# Patient Record
Sex: Male | Born: 1955 | Race: Black or African American | Hispanic: No | Marital: Married | Smoking: Never smoker
Health system: Southern US, Community
[De-identification: ages and names within clinical notes are randomized; demographics above are authoritative.]

## PROBLEM LIST (undated history)

## (undated) DIAGNOSIS — N201 Calculus of ureter: Secondary | ICD-10-CM

## (undated) DIAGNOSIS — E119 Type 2 diabetes mellitus without complications: Secondary | ICD-10-CM

## (undated) DIAGNOSIS — N289 Disorder of kidney and ureter, unspecified: Secondary | ICD-10-CM

## (undated) HISTORY — PX: ABDOMINAL SURGERY: SHX537

---

## 2005-11-01 ENCOUNTER — Encounter (INDEPENDENT_AMBULATORY_CARE_PROVIDER_SITE_OTHER): Payer: Self-pay | Admitting: Specialist

## 2005-11-02 ENCOUNTER — Inpatient Hospital Stay (HOSPITAL_COMMUNITY): Admission: EM | Admit: 2005-11-02 | Discharge: 2005-11-03 | Payer: Self-pay | Admitting: Emergency Medicine

## 2008-12-13 ENCOUNTER — Emergency Department (HOSPITAL_COMMUNITY): Admission: EM | Admit: 2008-12-13 | Discharge: 2008-12-14 | Payer: Self-pay | Admitting: Emergency Medicine

## 2010-07-15 LAB — URINE MICROSCOPIC-ADD ON

## 2010-07-15 LAB — URINALYSIS, ROUTINE W REFLEX MICROSCOPIC
Leukocytes, UA: NEGATIVE
Protein, ur: 100 mg/dL — AB
Urobilinogen, UA: 0.2 mg/dL (ref 0.0–1.0)
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
## 2012-04-26 ENCOUNTER — Emergency Department
Admission: EM | Admit: 2012-04-26 | Discharge: 2012-04-26 | Disposition: A | Discharge status: Home / Self Care | Payer: Self-pay | Attending: Emergency Medicine | Admitting: Emergency Medicine

## 2012-04-26 ENCOUNTER — Emergency Department: Payer: Self-pay

## 2012-04-26 DIAGNOSIS — M542 Cervicalgia: Secondary | ICD-10-CM | POA: Insufficient documentation

## 2012-04-26 DIAGNOSIS — R42 Dizziness and giddiness: Secondary | ICD-10-CM | POA: Insufficient documentation

## 2012-04-26 LAB — ECG 12-LEAD
Atrial Rate: 61 {beats}/min
P Axis: 77 degrees
P-R Interval: 168 ms
Q-T Interval: 394 ms
QRS Duration: 72 ms
QTC Calculation (Bezet): 396 ms
R Axis: 54 degrees
T Axis: 67 degrees
Ventricular Rate: 61 {beats}/min

## 2012-04-26 LAB — CBC AND DIFFERENTIAL
Basophils Absolute Automated: 0.04 10*3/uL (ref 0.00–0.20)
Basophils Automated: 1 %
Eosinophils Absolute Automated: 0.23 10*3/uL (ref 0.00–0.70)
Eosinophils Automated: 4 %
Hematocrit: 46.9 % (ref 42.0–52.0)
Hgb: 16.4 g/dL (ref 13.0–17.0)
Immature Granulocytes Absolute: 0.02 10*3/uL
Immature Granulocytes: 0 %
Lymphocytes Absolute Automated: 2.23 10*3/uL (ref 0.50–4.40)
Lymphocytes Automated: 41 %
MCH: 29.5 pg (ref 28.0–32.0)
MCHC: 35 g/dL (ref 32.0–36.0)
MCV: 84.4 fL (ref 80.0–100.0)
MPV: 10.6 fL (ref 9.4–12.3)
Monocytes Absolute Automated: 0.51 10*3/uL (ref 0.00–1.20)
Monocytes: 9 %
Neutrophils Absolute: 2.37 10*3/uL (ref 1.80–8.10)
Neutrophils: 44 %
Nucleated RBC: 0 /100 WBC (ref 0–1)
Platelets: 301 10*3/uL (ref 140–400)
RBC: 5.56 10*6/uL (ref 4.70–6.00)
RDW: 13 % (ref 12–15)
WBC: 5.4 10*3/uL (ref 3.50–10.80)

## 2012-04-26 LAB — BASIC METABOLIC PANEL
BUN: 9 mg/dL (ref 9.0–21.0)
CO2: 27 mEq/L (ref 22–29)
Calcium: 9.3 mg/dL (ref 8.5–10.5)
Chloride: 104 mEq/L (ref 98–107)
Creatinine: 1 mg/dL (ref 0.7–1.3)
Glucose: 103 mg/dL — ABNORMAL HIGH (ref 70–100)
Potassium: 4 mEq/L (ref 3.5–5.1)
Sodium: 141 mEq/L (ref 136–145)

## 2012-04-26 LAB — GFR: EGFR: >60

## 2012-04-26 LAB — I-STAT TROPONIN: i-STAT Troponin: 0 ng/mL (ref 0.00–0.09)

## 2012-04-26 MED ORDER — ONDANSETRON HCL 4 MG/2ML IJ SOLN
4.0000 mg | Freq: Once | INTRAMUSCULAR | Status: AC
Start: 2012-04-26 — End: 2012-04-26
  Administered 2012-04-26: 4 mg via INTRAVENOUS
  Filled 2012-04-26: qty 2

## 2012-04-26 MED ORDER — MECLIZINE HCL 25 MG PO TABS
25.0000 mg | ORAL_TABLET | Freq: Three times a day (TID) | ORAL | Status: AC | PRN
Start: 2012-04-26 — End: 2012-05-06

## 2012-04-26 MED ORDER — MECLIZINE HCL 12.5 MG PO TABS
25.0000 mg | ORAL_TABLET | Freq: Once | ORAL | Status: AC
Start: 2012-04-26 — End: 2012-04-26
  Administered 2012-04-26: 25 mg via ORAL
  Filled 2012-04-26: qty 2

## 2012-04-26 MED ORDER — SODIUM CHLORIDE 0.9 % IV BOLUS
1000.0000 mL | Freq: Once | INTRAVENOUS | Status: AC
Start: 2012-04-26 — End: 2012-04-26
  Administered 2012-04-26: 1000 mL via INTRAVENOUS

## 2012-04-26 NOTE — ED Provider Notes (Signed)
Physician/Midlevel provider first contact with patient: 04/26/12 1430             EMERGENCY DEPARTMENT HISTORY AND PHYSICAL EXAM    Date: 04/26/2012  Patient Name: Tyler West,Tyler West      Disposition and Treatment Plan    Clinical Impression:   1. Vertigo      ED Disposition     Discharge Barry Edy discharge to home/self care.    Condition at discharge: Good               History of Presenting Illness     Chief Complaint   Patient presents with   . Dizziness     Context: at rest  Location: n/a  Duration: onset last night, worse at 0400 when he woke up  Quality: dizziness  Timing: intermittent  Maximum Severity: moderate  Modifying Factors: worse when getting up  History Obtained From: patient and family member  Additional History: Tyler West is a 57 y.o. male o/w healthy c/o dizziness last night a/w visual deficits in both eyes, HA, and palpitations. Pt reports he had mild dizziness when he went to bed last night. Pt reports dizziness got much worse when he got out of bed this am ~0400 and he fell to the floor. Pt then got diaphoresis, nauseous and emesis x1. Pt sat down in a chair for ~30 minutes, pt reports dizziness got slightly better, and less dizziness after urination. Pt reports no dizziness in ED. Pt reports he has intermittent episodes of dizziness at work when going up and down stairs. Pt also c/o intermittent c spine pain that has gotten more frequent in the past week. Pt had cold sxs earlier this month, and has been taking left over amoxicillin. Pt denies CP, SOB, and any other symptoms or concerns.     PCP: Pcp, Noneorunknown, MD    Current facility-administered medications:[COMPLETED] meclizine (ANTIVERT) tablet 25 mg, 25 mg, Oral, Once, Mechanick, Bosie Clos, MD, 25 mg at 04/26/12 1558;  [COMPLETED] ondansetron (ZOFRAN) injection 4 mg, 4 mg, Intravenous, Once, Mechanick, Bosie Clos, MD, 4 mg at 04/26/12 1558;  [COMPLETED] sodium chloride 0.9 % bolus 1,000 mL, 1,000 mL, Intravenous, Once, Maurine Minister, MD,  1,000 mL at 04/26/12 1558  No current outpatient prescriptions on file.    Past Medical History     History reviewed. No pertinent past medical history.  Past Surgical History   Procedure Date   . Abdominal surgery        Family History     History reviewed. No pertinent family history.    Social History     History     Social History   . Marital Status: Divorced     Spouse Name: N/A     Number of Children: N/A   . Years of Education: N/A     Social History Main Topics   . Smoking status: Never Smoker    . Smokeless tobacco: Not on file   . Alcohol Use: No   . Drug Use: No   . Sexually Active: Not on file     Other Topics Concern   . Not on file     Social History Narrative   . No narrative on file       Allergies     No Known Allergies    Review of Systems     Positive and negative ROS elements as per HPI.  All Other Systems Reviewed and Negative: Yes    Physical Exam   BP 168/98  Pulse 54  Temp 95.5 F (35.3 C) (Oral)  Resp 18  Ht 1.651 m  Wt 58.514 kg  BMI 21.47 kg/m2  SpO2 100%  Physical Exam   Nursing note and vitals reviewed.  Constitutional: He is oriented to person, place, and time and well-developed, well-nourished, and in no distress. No distress.   HENT:   Head: Normocephalic and atraumatic.   Right Ear: External ear normal.   Left Ear: External ear normal.   Mouth/Throat: No oropharyngeal exudate.   Eyes: Conjunctivae normal and EOM are normal. Pupils are equal, round, and reactive to light.   Neck: Normal range of motion. No JVD present. No tracheal deviation present.   Cardiovascular: Normal rate, regular rhythm and normal heart sounds.  Exam reveals no gallop and no friction rub.    No murmur heard.  Pulmonary/Chest: Effort normal and breath sounds normal. No respiratory distress. He has no wheezes. He has no rales.   Abdominal: Soft. He exhibits no distension. There is no tenderness. There is no rebound.   Neurological: He is alert and oriented to person, place, and time. No cranial nerve  deficit. Coordination normal. GCS score is 15.   Skin: Skin is warm. No rash noted. He is not diaphoretic. No erythema. No pallor.   Psychiatric: Mood, memory, affect and judgment normal.         Diagnostic Study Results   Labs -     Results     Procedure Component Value Units Date/Time    Basic Metabolic Panel (BMP) [[id number]]  (Abnormal) Collected:04/26/12 1556    Specimen Information:Blood Updated:04/26/12 1629     Glucose 103 (H) mg/dL      BUN 9.0 mg/dL      Creatinine 1.0 mg/dL      Calcium 9.3 mg/dL      Sodium 027 mEq/L      Potassium 4.0 mEq/L      Chloride 104 mEq/L      CO2 27 mEq/L     GFR [[id number]] Collected:04/26/12 1556     EGFR >60.0   Updated:04/26/12 1629    CBC and Differential [[id number]] Collected:04/26/12 1556    Specimen Information:Blood / Blood Updated:04/26/12 1613     WBC 5.40 x10 3/uL      RBC 5.56 x10 6/uL      Hgb 16.4 g/dL      Hematocrit 87.5 %      MCV 84.4 fL      MCH 29.5 pg      MCHC 35.0 g/dL      RDW 13 %      Platelets 301 x10 3/uL      MPV 10.6 fL      Neutrophils 44 %      Lymphocytes Automated 41 %      Monocytes 9 %      Eosinophils Automated 4 %      Basophils Automated 1 %      Immature Granulocyte 0 %      Nucleated RBC 0 /100 WBC      Neutrophils Absolute 2.37 x10 3/uL      Abs Lymph Automated 2.23 x10 3/uL      Abs Mono Automated 0.51 x10 3/uL      Abs Eos Automated 0.23 x10 3/uL      Absolute Baso Automated 0.04 x10 3/uL      Absolute Immature Granulocyte 0.02 x10 3/uL     i-Stat Troponin [[id number]] Collected:04/26/12 1556     i-STAT Troponin 0.00  ng/mL Updated:04/26/12 1609        Radiologic Studies -   Radiology Results (24 Hour)     Procedure Component Value Units Date/Time    CT Head without Contrast [[id number]] Collected:04/26/12 1730    Order Status:Completed  Updated:04/26/12 1736    Narrative:    HISTORY: Dizziness     TECHNIQUE: Routine  CT head without  contrast.  COMPARISON: None.  FINDINGS:      Images are limited due to patient motion.   Unremarkable  brain for age.   No evidence of acute ischemia.  No acute hemorrhage, nor extra-axial fluid collection.  Ventricles and basal cistern are normal without mass effect.           Impression:     Unremarkable head CT       .    EKG Interpretation: NSR at 60, QRS axis 60, no STE/STD, TWI aVR - nl EKG    Clinical Course in the Emergency Department     4:52 PM  Pt ambulated to bathroom, no longer feeling dizzy    5:52 PM  Pt feeling better - labs and studies unrevealing.  Will rx meclizine and follow up referral    Medical Decision Making     I am the first provider for this patient.  I reviewed the vital signs, nursing notes, past medical history, past surgical history, family history and social history.      Vital Signs -   Patient Vitals for the past 12 hrs:   BP Temp Pulse Resp   04/26/12 1600 168/98 mmHg - 54  -   04/26/12 1532 177/95 mmHg - 60  -   04/26/12 1437 156/91 mmHg 95.5 F (35.3 C) 69  18        Pulse Oximetry Analysis - Normal    Differential Diagnosis (not completely inclusive): BPV v. Ill-defined dizziness v. Panic attack superimposed v. Doubt CNS or ACS ischemic event as cause - will check labs, CT and EKG to risk stratify, meclizine for presumed dx - sx s/w reproducible with Dix-Hallpike     Laboratory results reviewed by EDP: yes  Radiologic study results reviewed by EDP: yes  Radiologic Studies Interpreted (viewed) by EDP: yes        _______________________________    Attestations:  I was acting as a scribe for Lyn Henri, MD on Kaizer,Tzvi  Treatment Team: Scribe: Jim Desanctis     I am the first provider for this patient and I personally performed the services documented. Treatment Team: Scribe: Jim Desanctis is scribing for me on Tyler West,Tyler West. This note accurately reflects work and decisions made by me.  Lyn Henri, MD  ______________________________          Lyn Henri, MD  04/26/12 [id number]

## 2012-04-26 NOTE — ED Notes (Addendum)
Pt states that this morning when he woke up that he was really dizzy.  Pt states that he was so dizzy that when he stood up that he had to sit back down.  Denies nausea/vomiting. Pt states that she has intermittent posterior neck pain.  Pt states that he was treated 10 days ago for an ear infection.

## 2012-04-26 NOTE — Discharge Instructions (Signed)
Follow up with a primary care physician in the next week. If you do not have a primary care physician, call the [place] referral line to schedule a follow up appointment.    [place] Referral Line    You have been referred to a primary care doctor or a specialist for follow-up care. Please call the [place] referral line and they will be able to help you find a physician in your area that you can follow up with.    Phone: [phone] or [phone]      MEDICATION: MECLIZINE  Meclizine (brand name: Antivert, Bonine, Dramamine, Trav-L-Tabs, Medi-Meclizine) is used to prevent dizziness, vertigo, and motion sickness.  DIRECTIONS FOR USE:  To prevent motion sickness, take the first dose 1 hour before travel. You may take this medicine with or without food. Repeat the dose 1 or 2 times a day or as directed by your doctor. If you are taking the chewable tablets, chew them thoroughly before swallowing.  For control of dizziness and other conditions, this medicine works best when taken at regular intervals. After the first few days of treatment, once your symptoms improve, you may be able to take the medicine on an "as needed" basis. Discuss with your doctor.  WHAT TO WATCH FOR:   POSSIBLE SIDE EFFECTS: Drowsiness or fatigue (Reduce the dosage or wait longer between doses). Dry mouth (Chew gum or use a peppermint or other hard candy). Vision changes or problems with urination (Contact your doctor).  ALLERGIC REACTION: Rash, itching, swelling, trouble breathing or swallowing (Contact your doctor or return to this facility promptly).  MEDICAL CONDITIONS: Before starting this medicine, be sure your doctor knows if you have any of the following conditions:   Glaucoma   Prostate enlargement   Breathing problems   Seizures   Pregnancy or breastfeeding  DRUG INTERACTION: May cause excess drowsiness when taken with alcohol, muscle relaxant, sedative, pain medicine, anti-seizure medicines, medicines for sleep, or other  antihistamines. Use with caution.  Before starting this medicine, be sure your doctor knows if you are taking any of the following drugs: pramlintide, drugs with similar side effects (other anticholinergic drugs such as atropine, tricyclic antidepressants, benztropine, scopolamine).  WARNING:   Do not drive, ride a bicycle, or operate dangerous equipment while taking this medicine until you know how it will affect you.  [NOTE: This information topic may not include all directions, precautions, medical conditions, drug/food interactions, and warnings for this drug. Check with your doctor, nurse, or pharmacist for any questions that you may have.]   [address] [place], [place] [address]. All rights reserved. This information is not intended as a substitute for professional medical care. Always follow your healthcare professional's instructions.

## 2014-11-19 ENCOUNTER — Emergency Department (HOSPITAL_BASED_OUTPATIENT_CLINIC_OR_DEPARTMENT_OTHER): Payer: Self-pay

## 2014-11-19 ENCOUNTER — Emergency Department (HOSPITAL_BASED_OUTPATIENT_CLINIC_OR_DEPARTMENT_OTHER)
Admission: EM | Admit: 2014-11-19 | Discharge: 2014-11-19 | Disposition: A | Discharge status: Home / Self Care | Payer: Self-pay | Attending: Emergency Medicine | Admitting: Emergency Medicine

## 2014-11-19 ENCOUNTER — Encounter (HOSPITAL_BASED_OUTPATIENT_CLINIC_OR_DEPARTMENT_OTHER): Payer: Self-pay | Admitting: Emergency Medicine

## 2014-11-19 DIAGNOSIS — R109 Unspecified abdominal pain: Secondary | ICD-10-CM

## 2014-11-19 DIAGNOSIS — R197 Diarrhea, unspecified: Secondary | ICD-10-CM | POA: Insufficient documentation

## 2014-11-19 DIAGNOSIS — N201 Calculus of ureter: Secondary | ICD-10-CM | POA: Insufficient documentation

## 2014-11-19 HISTORY — DX: Type 2 diabetes mellitus without complications: E11.9

## 2014-11-19 LAB — URINE MICROSCOPIC-ADD ON

## 2014-11-19 LAB — URINALYSIS, ROUTINE W REFLEX MICROSCOPIC
BILIRUBIN URINE: NEGATIVE
Glucose, UA: 250 mg/dL — AB
Hgb urine dipstick: NEGATIVE
KETONES UR: 15 mg/dL — AB
LEUKOCYTES UA: NEGATIVE
NITRITE: NEGATIVE
PH: 6 (ref 5.0–8.0)
PROTEIN: 30 mg/dL — AB
Specific Gravity, Urine: 1.021 (ref 1.005–1.030)
Urobilinogen, UA: 0.2 mg/dL (ref 0.0–1.0)

## 2014-11-19 LAB — COMPREHENSIVE METABOLIC PANEL
ALT: 41 U/L (ref 17–63)
ANION GAP: 10 (ref 5–15)
AST: 45 U/L — ABNORMAL HIGH (ref 15–41)
Albumin: 3.9 g/dL (ref 3.5–5.0)
Alkaline Phosphatase: 113 U/L (ref 38–126)
BUN: 18 mg/dL (ref 6–20)
CHLORIDE: 107 mmol/L (ref 101–111)
CO2: 25 mmol/L (ref 22–32)
CREATININE: 1.11 mg/dL (ref 0.61–1.24)
Calcium: 8.9 mg/dL (ref 8.9–10.3)
GFR calc non Af Amer: >60 mL/min (ref >60)
Glucose, Bld: 189 mg/dL — ABNORMAL HIGH (ref 65–99)
Potassium: 3.4 mmol/L — ABNORMAL LOW (ref 3.5–5.1)
Sodium: 142 mmol/L (ref 135–145)
Total Bilirubin: 0.8 mg/dL (ref 0.3–1.2)
Total Protein: 6.8 g/dL (ref 6.5–8.1)

## 2014-11-19 LAB — CBC WITH DIFFERENTIAL/PLATELET
BASOS ABS: 0 10*3/uL (ref 0.0–0.1)
BASOS PCT: 0 % (ref 0–1)
EOS ABS: 0.4 10*3/uL (ref 0.0–0.7)
Eosinophils Relative: 5 % (ref 0–5)
HCT: 42.8 % (ref 39.0–52.0)
Hemoglobin: 14.4 g/dL (ref 13.0–17.0)
LYMPHS ABS: 2.6 10*3/uL (ref 0.7–4.0)
Lymphocytes Relative: 31 % (ref 12–46)
MCH: 28.8 pg (ref 26.0–34.0)
MCHC: 33.6 g/dL (ref 30.0–36.0)
MCV: 85.6 fL (ref 78.0–100.0)
Monocytes Absolute: 0.6 10*3/uL (ref 0.1–1.0)
Monocytes Relative: 7 % (ref 3–12)
NEUTROS PCT: 57 % (ref 43–77)
Neutro Abs: 4.9 10*3/uL (ref 1.7–7.7)
PLATELETS: 317 10*3/uL (ref 150–400)
RBC: 5 MIL/uL (ref 4.22–5.81)
RDW: 14 % (ref 11.5–15.5)
WBC: 8.6 10*3/uL (ref 4.0–10.5)

## 2014-11-19 MED ORDER — SODIUM CHLORIDE 0.9 % IV BOLUS (SEPSIS): 1000.0000 mL | Freq: Once | INTRAVENOUS | Status: DC

## 2014-11-19 MED ORDER — ONDANSETRON HCL 4 MG PO TABS: 4.0000 mg | ORAL_TABLET | Freq: Three times a day (TID) | ORAL | Status: DC | PRN

## 2014-11-19 MED ORDER — ONDANSETRON HCL 4 MG/2ML IJ SOLN
4.0000 mg | Freq: Once | INTRAMUSCULAR | Status: AC
  Administered 2014-11-19: 4 mg via INTRAVENOUS
  Filled 2014-11-19: qty 2

## 2014-11-19 MED ORDER — HYDROMORPHONE HCL 1 MG/ML IJ SOLN
1.0000 mg | Freq: Once | INTRAMUSCULAR | Status: AC
  Administered 2014-11-19: 1 mg via INTRAVENOUS
  Filled 2014-11-19: qty 1

## 2014-11-19 MED ORDER — OXYCODONE-ACETAMINOPHEN 5-325 MG PO TABS: 1.0000 | ORAL_TABLET | Freq: Four times a day (QID) | ORAL | Status: DC | PRN

## 2014-11-19 MED ORDER — SODIUM CHLORIDE 0.9 % IV BOLUS (SEPSIS)
1000.0000 mL | Freq: Once | INTRAVENOUS | Status: AC
  Administered 2014-11-19: 1000 mL via INTRAVENOUS

## 2014-11-19 MED ORDER — TAMSULOSIN HCL 0.4 MG PO CAPS: 0.4000 mg | ORAL_CAPSULE | Freq: Every day | ORAL | Status: DC

## 2014-11-19 NOTE — ED Notes (Signed)
MD at bedside. 

## 2014-11-19 NOTE — ED Provider Notes (Signed)
CSN: [id number]     Arrival date & time 11/19/14  9562 History   First MD Initiated Contact with Patient 11/19/14 0855     No chief complaint on file.    (Consider location/radiation/quality/duration/timing/severity/associated sxs/prior Treatment) Patient is a 59 y.o. male presenting with abdominal pain.  Abdominal Pain Pain location:  R flank Pain quality: sharp and stabbing   Pain radiates to:  Does not radiate Pain severity:  Severe Onset quality:  Sudden Duration:  1 hour Timing:  Constant Progression:  Unchanged Chronicity:  Recurrent (pt reports it feels similar to kidney stone pain from many years ago.) Context comment:  Spontaneous. Relieved by:  Nothing Worsened by:  Nothing tried Ineffective treatments:  None tried Associated symptoms: diarrhea, nausea and vomiting   Associated symptoms: no dysuria, no fever and no hematuria     No past medical history on file. No past surgical history on file. No family history on file. Social History  Substance Use Topics  . Smoking status: Not on file  . Smokeless tobacco: Not on file  . Alcohol Use: Not on file    Review of Systems  Constitutional: Negative for fever.  Gastrointestinal: Positive for nausea, vomiting, abdominal pain and diarrhea.  Genitourinary: Negative for dysuria and hematuria.  All other systems reviewed and are negative.     Allergies  Review of patient's allergies indicates not on file.  Home Medications   Prior to Admission medications   Not on File   BP 122/95 mmHg  Pulse 72  Temp(Src) 98.2 F (36.8 C) (Oral)  Resp 22  SpO2 97% Physical Exam  Constitutional: He is oriented to person, place, and time. He appears well-developed and well-nourished. He appears distressed (In pain).  HENT:  Head: Normocephalic and atraumatic.  Mouth/Throat: Oropharynx is clear and moist.  Eyes: Conjunctivae are normal. Pupils are equal, round, and reactive to light. No scleral icterus.  Neck: Neck  supple.  Cardiovascular: Normal rate, regular rhythm, normal heart sounds and intact distal pulses.   No murmur heard. Pulmonary/Chest: Effort normal and breath sounds normal. No stridor. No respiratory distress. He has no wheezes. He has no rales.  Abdominal: Soft. He exhibits no distension. There is no tenderness. There is CVA tenderness (Right). There is no rigidity, no rebound and no guarding.  Musculoskeletal: Normal range of motion. He exhibits no edema.  Neurological: He is alert and oriented to person, place, and time.  Skin: Skin is warm and dry. No rash noted. He is not diaphoretic.  Psychiatric: He has a normal mood and affect. His behavior is normal.  Nursing note and vitals reviewed.   ED Course  Procedures (including critical care time) Labs Review Labs Reviewed  COMPREHENSIVE METABOLIC PANEL - Abnormal; Notable for the following:    Potassium 3.4 (*)    Glucose, Bld 189 (*)    AST 45 (*)    All other components within normal limits  URINALYSIS, ROUTINE W REFLEX MICROSCOPIC (NOT AT [facility]) - Abnormal; Notable for the following:    Glucose, UA 250 (*)    Ketones, ur 15 (*)    Protein, ur 30 (*)    All other components within normal limits  URINE MICROSCOPIC-ADD ON - Abnormal; Notable for the following:    Bacteria, UA FEW (*)    All other components within normal limits  CBC WITH DIFFERENTIAL/PLATELET    Imaging Review Ct Renal Stone Study  11/19/2014   ADDENDUM REPORT: 11/19/2014 10:31  ADDENDUM: 4 mm nodule is noted  laterally in right middle lobe. If the patient is at high risk for bronchogenic carcinoma, follow-up chest CT at 1 year is recommended. If the patient is at low risk, no follow-up is needed. This recommendation follows the consensus statement: Guidelines for Management of Small Pulmonary Nodules Detected on CT Scans: A Statement from the Fleischner Society as published in Radiology 2005; 237:395-400.   Electronically Signed   By: Lupita Raider, M.D.   On:  11/19/2014 10:31   11/19/2014   CLINICAL DATA:  Acute right flank pain.  EXAM: CT ABDOMEN AND PELVIS WITHOUT CONTRAST  TECHNIQUE: Multidetector CT imaging of the abdomen and pelvis was performed following the standard protocol without IV contrast.  COMPARISON:  CT scan of December 13, 2008.  FINDINGS: 4 mm subpleural nodule is noted in the lateral portion of the right middle lobe. No significant osseous abnormality is noted.  No gallstones are noted. No focal abnormality is noted in the liver, spleen or pancreas on these unenhanced images. Adrenal glands appear normal. Bilateral nephrolithiasis is noted. No hydronephrosis or renal obstruction is noted on the left. Mild right hydroureteronephrosis is noted secondary to 2 mm calculus at right ureterovesical junction. The appendix appears normal. There is no evidence of bowel obstruction. No abnormal fluid collection is noted. Urinary bladder appears normal. No significant adenopathy is noted.  IMPRESSION: Bilateral nephrolithiasis. Mild right hydroureteronephrosis secondary to 2 mm calculus at right ureterovesical junction.  Electronically Signed: By: Lupita Raider, M.D. On: 11/19/2014 09:46     EKG Interpretation None      MDM   Final diagnoses:  Right flank pain  Right ureteral stone    59 year old male with right flank pain. Sudden, severe. IV Dilaudid and Zofran for symptom control. CT renal stone study ordered.  Pain improved with IV Dilaudid. CT shows right 2 mm distal ureteral stone. Has remained well-appearing and nontoxic sent this pain got under control. DC home with urology follow-up. He was told about his pulmonary nodule and advised follow-up.  Blake Divine, MD 11/19/14 1538

## 2014-11-19 NOTE — ED Notes (Signed)
Rt flank pain with nausea  Onset last pm

## 2014-11-19 NOTE — Discharge Instructions (Signed)
Ureteral Colic (Kidney Stones) °Ureteral colic is the result of a condition when kidney stones form inside the kidney. Once kidney stones are formed they may move into the tube that connects the kidney with the bladder (ureter). If this occurs, this condition may cause pain (colic) in the ureter.  °CAUSES  °Pain is caused by stone movement in the ureter and the obstruction caused by the stone. °SYMPTOMS  °The pain comes and goes as the ureter contracts around the stone. The pain is usually intense, sharp, and stabbing in character. The location of the pain may move as the stone moves through the ureter. When the stone is near the kidney the pain is usually located in the back and radiates to the belly (abdomen). When the stone is ready to pass into the bladder the pain is often located in the lower abdomen on the side the stone is located. At this location, the symptoms may mimic those of a urinary tract infection with urinary frequency. Once the stone is located here it often passes into the bladder and the pain disappears completely. °TREATMENT  °· Your caregiver will provide you with medicine for pain relief. °· You may require specialized follow-up X-rays. °· The absence of pain does not always mean that the stone has passed. It may have just stopped moving. If the urine remains completely obstructed, it can cause loss of kidney function or even complete destruction of the involved kidney. It is your responsibility and in your interest that X-rays and follow-ups as suggested by your caregiver are completed. Relief of pain without passage of the stone can be associated with severe damage to the kidney, including loss of kidney function on that side. °· If your stone does not pass on its own, additional measures may be taken by your caregiver to ensure its removal. °HOME CARE INSTRUCTIONS  °· Increase your fluid intake. Water is the preferred fluid since juices containing vitamin C may acidify the urine making it  less likely for certain stones (uric acid stones) to pass. °· Strain all urine. A strainer will be provided. Keep all particulate matter or stones for your caregiver to inspect. °· Take your pain medicine as directed. °· Make a follow-up appointment with your caregiver as directed. °· Remember that the goal is passage of your stone. The absence of pain does not mean the stone is gone. Follow your caregiver's instructions. °· Only take over-the-counter or prescription medicines for pain, discomfort, or fever as directed by your caregiver. °SEEK MEDICAL CARE IF:  °· Pain cannot be controlled with the prescribed medicine. °· You have a fever. °· Pain continues for longer than your caregiver advises it should. °· There is a change in the pain, and you develop chest discomfort or constant abdominal pain. °· You feel faint or pass out. °MAKE SURE YOU:  °· Understand these instructions. °· Will watch your condition. °· Will get help right away if you are not doing well or get worse. °Document Released: 01/04/2005 Document Revised: 07/22/2012 Document Reviewed: 09/21/2010 °[employer]® Patient Information ©2015 [employer]. This information is not intended to replace advice given to you by your health care provider. Make sure you discuss any questions you have with your health care provider. ° °

## 2017-06-09 ENCOUNTER — Emergency Department
Admission: EM | Admit: 2017-06-09 | Discharge: 2017-06-09 | Disposition: A | Discharge status: Home / Self Care | Payer: Self-pay | Attending: Emergency Medicine | Admitting: Emergency Medicine

## 2017-06-09 ENCOUNTER — Emergency Department: Payer: Self-pay

## 2017-06-09 DIAGNOSIS — N132 Hydronephrosis with renal and ureteral calculous obstruction: Secondary | ICD-10-CM | POA: Insufficient documentation

## 2017-06-09 DIAGNOSIS — Z87442 Personal history of urinary calculi: Secondary | ICD-10-CM | POA: Insufficient documentation

## 2017-06-09 DIAGNOSIS — R05 Cough: Secondary | ICD-10-CM | POA: Insufficient documentation

## 2017-06-09 DIAGNOSIS — N200 Calculus of kidney: Secondary | ICD-10-CM

## 2017-06-09 DIAGNOSIS — R059 Cough, unspecified: Secondary | ICD-10-CM

## 2017-06-09 LAB — COMPREHENSIVE METABOLIC PANEL
ALT: 86 U/L — ABNORMAL HIGH (ref 0–55)
AST (SGOT): 29 U/L (ref 5–34)
Albumin/Globulin Ratio: 1.1 (ref 0.9–2.2)
Albumin: 3.5 g/dL (ref 3.5–5.0)
Alkaline Phosphatase: 179 U/L — ABNORMAL HIGH (ref 38–106)
Anion Gap: 10 (ref 5.0–15.0)
BUN: 17 mg/dL (ref 9.0–28.0)
Bilirubin, Total: 0.5 mg/dL (ref 0.2–1.2)
CO2: 25 mEq/L (ref 22–29)
Calcium: 9.6 mg/dL (ref 8.5–10.5)
Chloride: 102 mEq/L (ref 100–111)
Creatinine: 1.1 mg/dL (ref 0.7–1.3)
Globulin: 3.3 g/dL (ref 2.0–3.6)
Glucose: 200 mg/dL — ABNORMAL HIGH (ref 70–100)
Potassium: 4.6 mEq/L (ref 3.5–5.1)
Protein, Total: 6.8 g/dL (ref 6.0–8.3)
Sodium: 137 mEq/L (ref 136–145)

## 2017-06-09 LAB — URINALYSIS, REFLEX TO MICROSCOPIC EXAM IF INDICATED
Bilirubin, UA: NEGATIVE
Glucose, UA: NEGATIVE
Ketones UA: NEGATIVE
Leukocyte Esterase, UA: NEGATIVE
Nitrite, UA: NEGATIVE
Protein, UR: 30 — AB
Specific Gravity UA: 1.017 (ref 1.001–1.035)
Urine pH: 6 (ref 5.0–8.0)
Urobilinogen, UA: NEGATIVE mg/dL

## 2017-06-09 LAB — CBC AND DIFFERENTIAL
Absolute NRBC: 0 10*3/uL (ref 0.00–0.00)
Basophils Absolute Automated: 0.04 10*3/uL (ref 0.00–0.08)
Basophils Automated: 0.6 %
Eosinophils Absolute Automated: 0.27 10*3/uL (ref 0.00–0.44)
Eosinophils Automated: 4 %
Hematocrit: 42.7 % (ref 37.6–49.6)
Hgb: 14.3 g/dL (ref 12.5–17.1)
Immature Granulocytes Absolute: 0.05 10*3/uL (ref 0.00–0.07)
Immature Granulocytes: 0.7 %
Lymphocytes Absolute Automated: 1.67 10*3/uL (ref 0.42–3.22)
Lymphocytes Automated: 24.6 %
MCH: 28.7 pg (ref 25.1–33.5)
MCHC: 33.5 g/dL (ref 31.5–35.8)
MCV: 85.7 fL (ref 78.0–96.0)
MPV: 9.4 fL (ref 8.9–12.5)
Monocytes Absolute Automated: 0.54 10*3/uL (ref 0.21–0.85)
Monocytes: 8 %
Neutrophils Absolute: 4.21 10*3/uL (ref 1.10–6.33)
Neutrophils: 62.1 %
Nucleated RBC: 0 /100 WBC (ref 0.0–0.0)
Platelets: 324 10*3/uL (ref 142–346)
RBC: 4.98 10*6/uL (ref 4.20–5.90)
RDW: 13 % (ref 11–15)
WBC: 6.78 10*3/uL (ref 3.10–9.50)

## 2017-06-09 LAB — GFR: EGFR: >60

## 2017-06-09 LAB — HEMOLYSIS INDEX: Hemolysis Index: 13 (ref 0–18)

## 2017-06-09 MED ORDER — KETOROLAC TROMETHAMINE 15 MG/ML IJ SOLN
15.0000 mg | Freq: Once | INTRAMUSCULAR | Status: AC
Start: 2017-06-09 — End: 2017-06-09
  Administered 2017-06-09: 15 mg via INTRAVENOUS
  Filled 2017-06-09: qty 1

## 2017-06-09 MED ORDER — AZITHROMYCIN 250 MG PO TABS
ORAL_TABLET | ORAL | 0 refills | Status: AC
Start: 2017-06-09 — End: ?

## 2017-06-09 MED ORDER — SODIUM CHLORIDE 0.9 % IV BOLUS
1000.0000 mL | Freq: Once | INTRAVENOUS | Status: AC
Start: 2017-06-09 — End: 2017-06-09
  Administered 2017-06-09: 1000 mL via INTRAVENOUS

## 2017-06-09 MED ORDER — TAMSULOSIN HCL 0.4 MG PO CAPS
0.4000 mg | ORAL_CAPSULE | Freq: Every day | ORAL | 0 refills | Status: AC
Start: 2017-06-09 — End: 2017-06-23

## 2017-06-09 MED ORDER — MORPHINE SULFATE 4 MG/ML IJ/IV SOLN (WRAP)
4.0000 mg | Freq: Once | Status: AC
Start: 2017-06-09 — End: 2017-06-09
  Administered 2017-06-09: 4 mg via INTRAVENOUS
  Filled 2017-06-09: qty 1

## 2017-06-09 MED ORDER — NAPROXEN 250 MG PO TABS
500.0000 mg | ORAL_TABLET | Freq: Two times a day (BID) | ORAL | 0 refills | Status: AC
Start: 2017-06-09 — End: ?

## 2017-06-09 MED ORDER — TRAMADOL HCL 50 MG PO TABS
50.0000 mg | ORAL_TABLET | Freq: Four times a day (QID) | ORAL | 0 refills | Status: AC | PRN
Start: 2017-06-09 — End: ?

## 2017-06-09 NOTE — ED Notes (Signed)
CT Delayed: CT Department not ready for patient at this time as per Fish CT Tech

## 2017-06-09 NOTE — ED Triage Notes (Signed)
Tyler West is a 62 y.o. male c/o left flank pain since this morning. Pt reports hx of three kidney stones in the past. Denies any medication PTA. BP 160/78   Pulse 60   Temp 97.9 F (36.6 C) (Oral)   Resp 20   Ht 5\' 5"  (1.651 m)   Wt 58.5 kg   BMI 21.47 kg/m

## 2017-06-09 NOTE — ED Provider Notes (Signed)
EMERGENCY DEPARTMENT HISTORY AND PHYSICAL EXAM     Physician/Midlevel provider first contact with patient: 06/09/17 1610         Date: 06/09/2017  Patient Name: Tyler West  Attending Physician: Juliane Poot, MD PhD  Advanced Practice Provider: Sena Hitch    History of Presenting Illness       History Provided By: Patient    Additional History: Tyler West is a 62 y.o. male presenting to the ED with c/o L flank pain x 3 am this morning, states pain 10/10, intermittent, stabbing, denies associated symptoms. Admits for having kidney stone in the past.     PCP: Pcp, Noneorunknown, MD  SPECIALISTS:     Tyler West, Tyler West   Home Medication Instructions RUE:[id number]    Printed on:06/11/17 0834   Medication Information 08 AM 10 AM 12 Noon 06 PM 08 PM 10 PM Bed time             azithromycin (ZITHROMAX) 250 MG tablet  Take 2 tablets by mouth on day one, then take 1 tablet by mouth on days 2 through 5.            naproxen (NAPROSYN) 250 MG tablet  Take 2 tablets (500 mg total) by mouth 2 (two) times daily with meals.            tamsulosin (FLOMAX) 0.4 MG Cap  Take 1 capsule (0.4 mg total) by mouth daily.for 14 days Discontinue for lightheadedness            traMADol (ULTRAM) 50 MG tablet  Take 1 tablet (50 mg total) by mouth every 6 (six) hours as needed for Pain.                Past History     Past Medical History:  History reviewed. No pertinent past medical history.    Past Surgical History:  Past Surgical History:   Procedure Laterality Date   . ABDOMINAL SURGERY         Family History:  No family history on file.    Social History:  Social History     Social History   . Marital status: Divorced     Spouse name: N/A   . Number of children: N/A   . Years of education: N/A     Social History Main Topics   . Smoking status: Never Smoker   . Smokeless tobacco: None   . Alcohol use No   . Drug use: No   . Sexual activity: Not Asked     Other Topics Concern   . None     Social History Narrative   . None       Allergies:  No  Known Allergies    Review of Systems     Review of Systems   Constitutional: Negative for chills, diaphoresis, fever, malaise/fatigue and weight loss.   HENT: Negative for congestion.    Eyes: Negative for pain, discharge and redness.   Respiratory: Negative for shortness of breath.    Cardiovascular: Negative for chest pain and palpitations.   Gastrointestinal: Negative for abdominal pain, nausea and vomiting.   Genitourinary: Positive for flank pain. Negative for dysuria, frequency, hematuria and urgency.   Musculoskeletal: Negative for back pain, joint pain, myalgias and neck pain.   Skin: Negative for itching and rash.   Neurological: Negative for dizziness, weakness and headaches.   Endo/Heme/Allergies: Negative for environmental allergies.   Psychiatric/Behavioral: Negative for depression.  Physical Exam     Vitals:    06/09/17 0714 06/09/17 0901 06/09/17 1054   BP: 160/78 120/78 111/69   Pulse: 60 72 70   Resp: 20 18 16    Temp: 97.9 F (36.6 C) 98.1 F (36.7 C)    TempSrc: Oral Oral    SpO2:  100%    Weight: 58.5 kg     Height: 5\' 5"  (1.651 m)         Physical Exam   Constitutional: He is oriented to person, place, and time. He appears well-developed and well-nourished. No distress.   Non toxic   HENT:   Head: Normocephalic and atraumatic.   Right Ear: External ear normal.   Left Ear: External ear normal.   Nose: Nose normal.   Mouth/Throat: Oropharynx is clear and moist.   Eyes: Pupils are equal, round, and reactive to light. Conjunctivae and EOM are normal. Right eye exhibits no discharge. Left eye exhibits no discharge. No scleral icterus.   Neck: Normal range of motion. Neck supple.   Cardiovascular: Normal rate, regular rhythm, normal heart sounds and intact distal pulses.  Exam reveals no gallop and no friction rub.    No murmur heard.  Pulmonary/Chest: Effort normal and breath sounds normal. No respiratory distress.   Abdominal: Soft. Bowel sounds are normal. He exhibits no distension. There is  no tenderness.   Musculoskeletal: Normal range of motion. He exhibits no edema, tenderness or deformity.   Neurological: He is alert and oriented to person, place, and time. He has normal reflexes. No cranial nerve deficit. He exhibits normal muscle tone. Coordination normal.   Skin: Skin is warm. No rash noted. He is not diaphoretic. No erythema. No pallor.   Psychiatric: He has a normal mood and affect.   Nursing note and vitals reviewed.      Diagnostic Study Results     Labs -     Results     Procedure Component Value Units Date/Time    UA, Reflex to Microscopic (pts 3 + yrs) [[id number]]  (Abnormal) Collected:  06/09/17 0743    Specimen:  Urine Updated:  06/09/17 0817     Urine Type Clean Catch     Color, UA Yellow     Clarity, UA Hazy     Specific Gravity UA 1.017     Urine pH 6.0     Leukocyte Esterase, UA Negative     Nitrite, UA Negative     Protein, UR 30 (A)     Glucose, UA Negative     Ketones UA Negative     Urobilinogen, UA Negative mg/dL      Bilirubin, UA Negative     Blood, UA Large (A)     RBC, UA 26 - 50 (A) /hpf      WBC, UA 0 - 5 /hpf      Urine Mucus Present    Comprehensive metabolic panel [[id number]]  (Abnormal) Collected:  06/09/17 0743    Specimen:  Blood Updated:  06/09/17 0814     Glucose 200 (H) mg/dL      BUN 13.2 mg/dL      Creatinine 1.1 mg/dL      Sodium 440 mEq/L      Potassium 4.6 mEq/L      Chloride 102 mEq/L      CO2 25 mEq/L      Calcium 9.6 mg/dL      Protein, Total 6.8 g/dL      Albumin 3.5 g/dL  AST (SGOT) 29 U/L      ALT 86 (H) U/L      Alkaline Phosphatase 179 (H) U/L      Bilirubin, Total 0.5 mg/dL      Globulin 3.3 g/dL      Albumin/Globulin Ratio 1.1     Anion Gap 10.0    Hemolysis index [[id number]] Collected:  06/09/17 0743     Updated:  06/09/17 0814     Hemolysis Index 13    GFR [[id number]] Collected:  06/09/17 0743     Updated:  06/09/17 0814     EGFR >60.0    CBC with differential [[id number]] Collected:  06/09/17 0743    Specimen:  Blood from Blood Updated:   06/09/17 0759     WBC 6.78 x10 3/uL      Hgb 14.3 g/dL      Hematocrit 03.4 %      Platelets 324 x10 3/uL      RBC 4.98 x10 6/uL      MCV 85.7 fL      MCH 28.7 pg      MCHC 33.5 g/dL      RDW 13 %      MPV 9.4 fL      Neutrophils 62.1 %      Lymphocytes Automated 24.6 %      Monocytes 8.0 %      Eosinophils Automated 4.0 %      Basophils Automated 0.6 %      Immature Granulocyte 0.7 %      Nucleated RBC 0.0 /100 WBC      Neutrophils Absolute 4.21 x10 3/uL      Abs Lymph Automated 1.67 x10 3/uL      Abs Mono Automated 0.54 x10 3/uL      Abs Eos Automated 0.27 x10 3/uL      Absolute Baso Automated 0.04 x10 3/uL      Absolute Immature Granulocyte 0.05 x10 3/uL      Absolute NRBC 0.00 x10 3/uL           Radiologic Studies -   Radiology Results (24 Hour)     Procedure Component Value Units Date/Time    CT Abd/Pelvis without Contrast [[id number]] Collected:  06/09/17 0858    Order Status:  Completed Updated:  06/09/17 0909    Narrative:       CT ABDOMEN PELVIS WO IV/ WO PO CONT 06/09/2017 8:51 AM    Clinical history: FLANK PAIN, STONE KNOWN OR SUSPECTED    Comparison: none available.      Technique: Axial images are obtained through the abdomen and pelvis.  Sagittal and coronal reformations were made.    The following ?dose reduction techniques were utilized: automated  exposure control and/or adjustment of the mA and/or kV according  to patient size, and the use of iterative reconstruction technique.    FINDINGS:    Mild dependent atelectasis versus infiltrate. Cardiac size is within  normal limits. Minimal pericardial fluid.    Noncontrast images of the liver, spleen, right adrenal gland, pancreas  and gallbladder imaging normal. Mild thickening of the left adrenal  gland.    Bilateral small (1 to 3 mm each) scattered nonobstructive  nephrolithiasis. There is an obstructive stone in the proximal left  ureter at the level of L4/L5 measuring 4 mm causing mild left-sided  hydronephrosis. No evidence of hydronephrosis on the  right.    Mild nonspecific thickening of the urinary bladder which may be related  to  poor distention. The prostate gland and seminal vesicles imaging  normal.    Atherosclerotic changes in the aorta and iliac arteries. No aneurysmal  dilatation of the aorta. Inferior vena cava imaging normal.    The stomach, small bowel and colon imaging normal. The appendix is not  visualized, however there are no inflammatory changes in the right lower  quadrant to suggest an acute appendicitis. No free air, free fluid or  collections. There is no enlarged lymphadenopathy. Spondylosis causing  mild bilateral neural foramina narrowing at L5 and S1.      Impression:        Bilateral nonobstructive nephrolithiasis.    4 mm obstructive stone in the proximal left ureter at L4/L5 level  causing mild left-sided hydronephrosis.       Joselyn Glassman, MD   06/09/2017 9:05 AM      .    Medical Decision Making   I am the first provider for this patient.    I reviewed the vital signs, available nursing notes, past medical history, past surgical history, family history and social history.    Vital Signs-Reviewed the patient's vital signs.     No data found.      Pulse Oximetry Analysis - Normal 100 % on RA   Clinical Decision Support:       ED Course:     Morphine 4 m, Toradol 15 mg IV, 1L NS  09:11 on revaluation pt states she feels better.  Pt on discharge he states he has been having cough for the past week.    Provider Notes:    62 y.o. male with kidney stone will refer to urologist.    Rx use and side effects, results, home self care, discharge instructions, and return precautions discussed extensively with patient. . Possibility of evolving illness reviewed. All questions solicited and addressed. Patient is amenable to discharge.     Discharge Prescriptions     Medication Sig Dispense Auth. Provider    traMADol (ULTRAM) 50 MG tablet Take 1 tablet (50 mg total) by mouth every 6 (six) hours as needed for Pain. 12 tablet Katryn Plummer T, [place]     naproxen (NAPROSYN) 250 MG tablet Take 2 tablets (500 mg total) by mouth 2 (two) times daily with meals. 20 tablet Alby Schwabe T, [place]    tamsulosin (FLOMAX) 0.4 MG Cap Take 1 capsule (0.4 mg total) by mouth daily.for 14 days Discontinue for lightheadedness 14 capsule Rodricus Candelaria T, [place]    azithromycin (ZITHROMAX) 250 MG tablet Take 2 tablets by mouth on day one, then take 1 tablet by mouth on days 2 through 5. 6 tablet Janace Hoard T, [place]            Diagnosis     Clinical Impression:   1. Kidney stone    2. Cough        Treatment Plan:   ED Disposition     ED Disposition Condition Date/Time Comment    Discharge  Sat Jun 09, 2017 10:11 AM Tyler West discharge to home/self care.    Condition at disposition: Stable            _______________________________    CHART OWNERSHIPOpal Sidles, PA-C, am the primary clinician of record.  _______________________________       Sena Hitch, PA  06/11/17 6213       Juliane Poot, MD PhD  06/11/17 2040

## 2017-06-09 NOTE — Discharge Instructions (Signed)
Kidney Stones    You have been seen for a kidney stone.    A kidney stone is a hard mineral and crystalline material (a lot like gravel). It forms inside the kidney or the urinary tract. When it moves from the kidney into the tube between the kidney and the bladder (the ureter), it causes severe pain. Kidney stones form when there is less urine (pee) or too many stone-forming substances in the urine. Normally, kidney stones are made of calcium oxalate or calcium phosphate. Kidney stones associated with infection in the urinary tract are called struvite or infection stones.    Symptoms include sharp pain in the side that may radiate (spread) to the groin. Nausea and vomiting are also common. A doctor diagnosed your kidney stone based on your exam, urine test, and medical history. The doctor may have run a special test called a helical CT stone study or an intravenous pyelogram (IVP).    Men get kidney stones more often than women. Whites get them more often than African-Americans. Kidney stones become more common when men reach their 40s. The risk gets higher with age. People who have already had more than one kidney stone often get more stones.    There are different conditions that can cause kidney stones: Hypercalcuria is an inherited (genetic) condition that causes high calcium in the urine. This causes stones in more than half of cases. There are other conditions that cause an increased risk of kidney stones. These include gout (a joint condition), hyperparathyroidism (a hormone condition), inflammatory bowel disease (Crohn's disease and Ulcerative colitis) and intestinal bypass surgery. They also include kidney diseases like renal tubular acidosis. Certain medicines also increase the risk of kidney stones. These include some diuretics (water pills), antacids with calcium, and the HIV drug indinavir (Crixivan).    Most kidney stones pass on their own. Larger stones or stones that don't pass in a few  days may need to be taken out. This is done by a urologist (a doctor who specializes in the urinary tract). Kidney stones are normally treated with pain and nausea medicine.    You should drink lots of fluid--up to 8 glasses of water a day. You should follow up with a urologist in the next 2-3 weeks. Strain all of your urine so you can catch the kidney stone as it passes out of the bladder. Keep the stone and bring it with you to your urology appointment. The urologist may have the stone tested to find out what it is made of. This may help the urologist recommend diet changes. He or she may also suggest medicines to help prevent more kidney stones.    YOU SHOULD SEEK MEDICAL ATTENTION IMMEDIATELY, EITHER HERE OR AT THE NEAREST EMERGENCY DEPARTMENT, IF ANY OF THE FOLLOWING OCCURS:   The pain gets worse or the medicine isn't enough to treat your pain.   You get sick (nausea) or vomit and can t keep down fluids or pain medicine.   You have a fever (temperature higher than 100.4F / 38C) or shaking chills.

## 2017-11-26 ENCOUNTER — Ambulatory Visit: Payer: Self-pay | Admitting: Emergency Medicine

## 2017-11-28 ENCOUNTER — Emergency Department (HOSPITAL_COMMUNITY): Payer: BLUE CROSS/BLUE SHIELD

## 2017-11-28 ENCOUNTER — Encounter (HOSPITAL_COMMUNITY)
Admission: EM | Disposition: A | Discharge status: Home / Self Care | Payer: Self-pay | Source: Home / Self Care | Attending: Emergency Medicine

## 2017-11-28 ENCOUNTER — Observation Stay (HOSPITAL_COMMUNITY): Payer: BLUE CROSS/BLUE SHIELD

## 2017-11-28 ENCOUNTER — Observation Stay (HOSPITAL_COMMUNITY)
Admission: EM | Admit: 2017-11-28 | Discharge: 2017-11-29 | Disposition: A | Discharge status: Home / Self Care | Payer: BLUE CROSS/BLUE SHIELD | Attending: Urology | Admitting: Urology

## 2017-11-28 ENCOUNTER — Emergency Department (HOSPITAL_COMMUNITY): Payer: BLUE CROSS/BLUE SHIELD | Admitting: Certified Registered"

## 2017-11-28 DIAGNOSIS — Z79899 Other long term (current) drug therapy: Secondary | ICD-10-CM | POA: Insufficient documentation

## 2017-11-28 DIAGNOSIS — N200 Calculus of kidney: Secondary | ICD-10-CM

## 2017-11-28 DIAGNOSIS — Z87442 Personal history of urinary calculi: Secondary | ICD-10-CM | POA: Insufficient documentation

## 2017-11-28 DIAGNOSIS — N136 Pyonephrosis: Principal | ICD-10-CM | POA: Insufficient documentation

## 2017-11-28 HISTORY — PX: CYSTOSCOPY WITH STENT PLACEMENT: SHX5790

## 2017-11-28 LAB — URINALYSIS, ROUTINE W REFLEX MICROSCOPIC
Bilirubin Urine: NEGATIVE
GLUCOSE, UA: NEGATIVE mg/dL
HGB URINE DIPSTICK: NEGATIVE
Ketones, ur: NEGATIVE mg/dL
Nitrite: POSITIVE — AB
PH: 6 (ref 5.0–8.0)
PROTEIN: NEGATIVE mg/dL
Specific Gravity, Urine: 1.013 (ref 1.005–1.030)

## 2017-11-28 LAB — CBC WITH DIFFERENTIAL/PLATELET
Basophils Absolute: 0 10*3/uL (ref 0.0–0.1)
Basophils Relative: 1 %
EOS PCT: 4 %
Eosinophils Absolute: 0.2 10*3/uL (ref 0.0–0.7)
HEMATOCRIT: 42 % (ref 39.0–52.0)
Hemoglobin: 14.5 g/dL (ref 13.0–17.0)
LYMPHS ABS: 3.3 10*3/uL (ref 0.7–4.0)
LYMPHS PCT: 53 %
MCH: 29.1 pg (ref 26.0–34.0)
MCHC: 34.5 g/dL (ref 30.0–36.0)
MCV: 84.2 fL (ref 78.0–100.0)
MONO ABS: 0.5 10*3/uL (ref 0.1–1.0)
MONOS PCT: 9 %
Neutro Abs: 2 10*3/uL (ref 1.7–7.7)
Neutrophils Relative %: 33 %
PLATELETS: 330 10*3/uL (ref 150–400)
RBC: 4.99 MIL/uL (ref 4.22–5.81)
RDW: 13.2 % (ref 11.5–15.5)
WBC: 6.1 10*3/uL (ref 4.0–10.5)

## 2017-11-28 LAB — COMPREHENSIVE METABOLIC PANEL
ALT: 37 U/L (ref 0–44)
AST: 32 U/L (ref 15–41)
Albumin: 4.2 g/dL (ref 3.5–5.0)
Alkaline Phosphatase: 133 U/L — ABNORMAL HIGH (ref 38–126)
Anion gap: 14 (ref 5–15)
BUN: 17 mg/dL (ref 6–20)
CHLORIDE: 105 mmol/L (ref 98–111)
CO2: 23 mmol/L (ref 22–32)
CREATININE: 1.05 mg/dL (ref 0.61–1.24)
Calcium: 9.5 mg/dL (ref 8.9–10.3)
Glucose, Bld: 191 mg/dL — ABNORMAL HIGH (ref 70–99)
POTASSIUM: 3.5 mmol/L (ref 3.5–5.1)
Sodium: 142 mmol/L (ref 135–145)
TOTAL PROTEIN: 7.3 g/dL (ref 6.5–8.1)
Total Bilirubin: 0.9 mg/dL (ref 0.3–1.2)

## 2017-11-28 LAB — LIPASE, BLOOD: LIPASE: 27 U/L (ref 11–51)

## 2017-11-28 LAB — GLUCOSE, CAPILLARY: Glucose-Capillary: 89 mg/dL (ref 70–99)

## 2017-11-28 SURGERY — CYSTOSCOPY, WITH STENT INSERTION
Anesthesia: General | Laterality: Left

## 2017-11-28 MED ORDER — MORPHINE SULFATE (PF) 4 MG/ML IV SOLN
4.0000 mg | Freq: Once | INTRAVENOUS | Status: AC
  Administered 2017-11-28: 4 mg via INTRAVENOUS
  Filled 2017-11-28: qty 1

## 2017-11-28 MED ORDER — ONDANSETRON HCL 4 MG/2ML IJ SOLN
INTRAMUSCULAR | Status: DC | PRN
  Administered 2017-11-28: 4 mg via INTRAVENOUS

## 2017-11-28 MED ORDER — LIDOCAINE 2% (20 MG/ML) 5 ML SYRINGE
INTRAMUSCULAR | Status: DC | PRN
  Administered 2017-11-28: 100 mg via INTRAVENOUS

## 2017-11-28 MED ORDER — SODIUM CHLORIDE 0.9 % IV SOLN
INTRAVENOUS | Status: DC
  Administered 2017-11-28 – 2017-11-29 (×3): via INTRAVENOUS

## 2017-11-28 MED ORDER — SODIUM CHLORIDE 0.9 % IV SOLN
1.0000 g | Freq: Once | INTRAVENOUS | Status: AC
  Administered 2017-11-28: 1 g via INTRAVENOUS
  Filled 2017-11-28: qty 10

## 2017-11-28 MED ORDER — ONDANSETRON HCL 4 MG PO TABS: 4.0000 mg | ORAL_TABLET | Freq: Four times a day (QID) | ORAL | Status: DC | PRN

## 2017-11-28 MED ORDER — OXYCODONE HCL 5 MG PO TABS: 5.0000 mg | ORAL_TABLET | ORAL | Status: DC | PRN

## 2017-11-28 MED ORDER — ONDANSETRON HCL 4 MG PO TABS: 4.0000 mg | ORAL_TABLET | Freq: Three times a day (TID) | ORAL | 0 refills | Status: DC | PRN

## 2017-11-28 MED ORDER — ACETAMINOPHEN 650 MG RE SUPP: 650.0000 mg | Freq: Four times a day (QID) | RECTAL | Status: DC | PRN

## 2017-11-28 MED ORDER — SODIUM CHLORIDE 0.9 % IV BOLUS
1000.0000 mL | Freq: Once | INTRAVENOUS | Status: AC
  Administered 2017-11-28: 1000 mL via INTRAVENOUS

## 2017-11-28 MED ORDER — KETOROLAC TROMETHAMINE 15 MG/ML IJ SOLN
15.0000 mg | Freq: Once | INTRAMUSCULAR | Status: AC
  Administered 2017-11-28: 15 mg via INTRAVENOUS
  Filled 2017-11-28: qty 1

## 2017-11-28 MED ORDER — KETOROLAC TROMETHAMINE 15 MG/ML IJ SOLN
15.0000 mg | Freq: Four times a day (QID) | INTRAMUSCULAR | Status: DC | PRN
  Administered 2017-11-29: 15 mg via INTRAVENOUS
  Filled 2017-11-28: qty 1

## 2017-11-28 MED ORDER — ONDANSETRON HCL 4 MG/2ML IJ SOLN
4.0000 mg | Freq: Once | INTRAMUSCULAR | Status: AC
  Administered 2017-11-28: 4 mg via INTRAVENOUS
  Filled 2017-11-28: qty 2

## 2017-11-28 MED ORDER — ACETAMINOPHEN 325 MG PO TABS: 650.0000 mg | ORAL_TABLET | Freq: Four times a day (QID) | ORAL | Status: DC | PRN

## 2017-11-28 MED ORDER — KETOROLAC TROMETHAMINE 10 MG PO TABS: 10.0000 mg | ORAL_TABLET | Freq: Three times a day (TID) | ORAL | 0 refills | Status: DC | PRN

## 2017-11-28 MED ORDER — PROPOFOL 10 MG/ML IV BOLUS
INTRAVENOUS | Status: DC | PRN
  Administered 2017-11-28: 140 mg via INTRAVENOUS

## 2017-11-28 MED ORDER — SODIUM CHLORIDE 0.9 % IR SOLN
Status: DC | PRN
  Administered 2017-11-28: 3000 mL via INTRAVESICAL

## 2017-11-28 MED ORDER — SODIUM CHLORIDE 0.9 % IV SOLN
1.0000 g | Freq: Every day | INTRAVENOUS | Status: DC
  Administered 2017-11-29: 1 g via INTRAVENOUS
  Filled 2017-11-28: qty 1

## 2017-11-28 MED ORDER — ONDANSETRON HCL 4 MG/2ML IJ SOLN: 4.0000 mg | Freq: Four times a day (QID) | INTRAMUSCULAR | Status: DC | PRN

## 2017-11-28 MED ORDER — HYDROCODONE-ACETAMINOPHEN 5-325 MG PO TABS: 1.0000 | ORAL_TABLET | Freq: Four times a day (QID) | ORAL | 0 refills | Status: DC | PRN

## 2017-11-28 MED ORDER — MIDAZOLAM HCL 2 MG/2ML IJ SOLN
INTRAMUSCULAR | Status: AC
  Filled 2017-11-28: qty 2

## 2017-11-28 MED ORDER — FENTANYL CITRATE (PF) 100 MCG/2ML IJ SOLN
INTRAMUSCULAR | Status: DC | PRN
  Administered 2017-11-28 (×2): 50 ug via INTRAVENOUS

## 2017-11-28 MED ORDER — DEXAMETHASONE SODIUM PHOSPHATE 10 MG/ML IJ SOLN
INTRAMUSCULAR | Status: DC | PRN
  Administered 2017-11-28: 10 mg via INTRAVENOUS

## 2017-11-28 MED ORDER — FENTANYL CITRATE (PF) 100 MCG/2ML IJ SOLN: 25.0000 ug | INTRAMUSCULAR | Status: DC | PRN

## 2017-11-28 MED ORDER — PROPOFOL 10 MG/ML IV BOLUS
INTRAVENOUS | Status: AC
  Filled 2017-11-28: qty 20

## 2017-11-28 MED ORDER — FENTANYL CITRATE (PF) 100 MCG/2ML IJ SOLN
INTRAMUSCULAR | Status: AC
  Filled 2017-11-28: qty 2

## 2017-11-28 MED ORDER — TAMSULOSIN HCL 0.4 MG PO CAPS: 0.4000 mg | ORAL_CAPSULE | Freq: Every day | ORAL | 0 refills | Status: DC

## 2017-11-28 MED ORDER — MIDAZOLAM HCL 2 MG/2ML IJ SOLN
INTRAMUSCULAR | Status: DC | PRN
  Administered 2017-11-28: 1 mg via INTRAVENOUS

## 2017-11-28 SURGICAL SUPPLY — 11 items
BAG URO CATCHER STRL LF (MISCELLANEOUS) ×3 IMPLANT
CATH INTERMIT  6FR 70CM (CATHETERS) ×3 IMPLANT
CLOTH BEACON ORANGE TIMEOUT ST (SAFETY) ×3 IMPLANT
GLOVE BIOGEL M STRL SZ7.5 (GLOVE) ×9 IMPLANT
GOWN STRL REUS W/TWL LRG LVL3 (GOWN DISPOSABLE) ×6 IMPLANT
GUIDEWIRE STR DUAL SENSOR (WIRE) ×3 IMPLANT
MANIFOLD NEPTUNE II (INSTRUMENTS) ×3 IMPLANT
PACK CYSTO (CUSTOM PROCEDURE TRAY) ×3 IMPLANT
STENT CONTOUR 6FRX24X.038 (STENTS) ×3 IMPLANT
TUBING CONNECTING 10 (TUBING) ×2 IMPLANT
TUBING CONNECTING 10' (TUBING) ×1

## 2017-11-28 NOTE — Transfer of Care (Signed)
Immediate Anesthesia Transfer of Care Note  Patient: Terry Hall  Procedure(s) Performed: CYSTOSCOPY WITH STENT PLACEMENT (Left )  Patient Location: PACU  Anesthesia Type:General  Level of Consciousness: sedated  Airway & Oxygen Therapy: Patient Spontanous Breathing and Patient connected to face mask oxygen  Post-op Assessment: Report given to RN and Post -op Vital signs reviewed and stable  Post vital signs: Reviewed and stable  Last Vitals:  Vitals Value Taken Time  BP 145/79 11/28/2017  9:48 PM  Temp 37.2 C 11/28/2017  9:48 PM  Pulse 92 11/28/2017  9:57 PM  Resp 17 11/28/2017  9:57 PM  SpO2 98 % 11/28/2017  9:57 PM  Vitals shown include unvalidated device data.  Last Pain:  Vitals:   11/28/17 2148  TempSrc:   PainSc: Asleep         Complications: No apparent anesthesia complications

## 2017-11-28 NOTE — Discharge Instructions (Addendum)
Please call if fever > 101 F or uncontrolled pain.

## 2017-11-28 NOTE — Anesthesia Procedure Notes (Signed)
Date/Time: 11/28/2017 9:38 PM Performed by: Minerva EndsMirarchi, Issiac Jamar M, CRNA Oxygen Delivery Method: Simple face mask Placement Confirmation: positive ETCO2 and breath sounds checked- equal and bilateral Dental Injury: Teeth and Oropharynx as per pre-operative assessment

## 2017-11-28 NOTE — ED Notes (Signed)
ED Provider at bedside. 

## 2017-11-28 NOTE — ED Provider Notes (Signed)
a Physical Exam  BP 137/76 (BP Location: Right Arm)   Pulse 81   Temp (!) 97.3 F (36.3 C) (Axillary)   Resp 20   SpO2 100%   Assumed care from Sophia Caccavale at 1600. Briefly, the patient is a 62 y.o. male with PMHx of  has a past medical history of Diabetes mellitus without complication. here with left flank pain.  Patient reporting that it is consistent with his prior episodes of nephrolithiasis, which he has had multiple including one in March 2019.  Pain was sudden onset in the left flank today.  Patient reports he is comfortable at this time after antiemetics and analgesics.  Patient never smoker without history of hypertension, and previous scans without aneurysmal dilatation of abdominal aorta.  Labs Reviewed  COMPREHENSIVE METABOLIC PANEL - Abnormal; Notable for the following components:      Result Value   Glucose, Bld 191 (*)    Alkaline Phosphatase 133 (*)    All other components within normal limits  URINE CULTURE  CBC WITH DIFFERENTIAL/PLATELET  LIPASE, BLOOD  URINALYSIS, ROUTINE W REFLEX MICROSCOPIC    Course of Care:   Physical Exam  Constitutional: He appears well-developed and well-nourished. No distress.  Sitting comfortably in bed.  HENT:  Head: Normocephalic and atraumatic.  Eyes: Conjunctivae are normal. Right eye exhibits no discharge. Left eye exhibits no discharge.  EOMs normal to gross examination.  Neck: Normal range of motion.  Cardiovascular: Normal rate and regular rhythm.  Intact, 2+ radial pulse.  Pulmonary/Chest:  Normal respiratory effort. Patient converses comfortably. No audible wheeze or stridor.  Abdominal: He exhibits no distension.  Tenderness palpation of left side of abdomen without guarding or rebound.  No focal tenderness.  Left CVA TTP.  Musculoskeletal: Normal range of motion.  Neurological: He is alert.  Cranial nerves intact to gross observation. Patient moves extremities without difficulty.  Skin: Skin is warm and dry. He  is not diaphoretic.  Psychiatric: He has a normal mood and affect. His behavior is normal. Judgment and thought content normal.  Nursing note and vitals reviewed.   ED Course/Procedures   Clinical Course as of Nov 29 2102  Wed Nov 28, 2017  1754 Noted. Patient in process of setting up care with PCP for prediabetes.   Glucose(!): 191 [AM]  1926 Will consult urology regarding infected appearing urine in setting of ureterolithiasis.  Urinalysis, Routine w reflex microscopic(!) [AM]  1950 Spoke with Dr. Lucretia RoersWood of urology who will come to evaluate the patient.  I appreciate Dr. Joseph ArtWoods involvement in the care of this patient.  Repeat abdominal exam with continued flank tenderness.  Will order additional dose of morphine.   [AM]  2021 Spoke with Dr. Lucretia RoersWood of nephrology, who will take the patient to the OR for stenting.  Appreciate his involvement.    [AM]    Clinical Course User Index [AM] Elisha PonderMurray, Elmina Hendel B, PA-C    Procedures  MDM  Patient nontoxic-appearing, afebrile, no acute distress.  Patient tolerating p.o. and comfortable at time of discharge.  No leukocytosis.  Normal renal function.  Of note, patient did have elevated glucose today of 191.  Patient reports he has had history of elevated glucose, is trying to find a primary care provider, and has previously sought to control his blood sugar with lifestyle adjustments.  Patient with urinalysis suggestive of infection.  Urology to take patient to the OR for stent.  Ultimate disposition will be made by urology.  Appreciate their involvement.  Elisha PonderMurray, Kasin Tonkinson B, PA-C 11/28/17 2104    Virgina Norfolkuratolo, Adam, DO 11/29/17 0008

## 2017-11-28 NOTE — Op Note (Signed)
Preoperative diagnosis:  1. Left ureteral stone 2. UTI   Postoperative diagnosis:  1. Left ureteral stone 2. UTI   Procedure:  1. Cystoscopy 2. Left ureteral stent placement (6 x 24 - no string)   Surgeon: Rolly SalterLester S. Arch Methot, Montez HagemanJr. M.D.  Anesthesia: General  Complications: None  EBL: Minimal  Specimens: None  Indication: Terry Hall is a 62 y.o. patient with a 6 mm left distal ureteral stone and UTI. After reviewing the management options for treatment, he elected to proceed with the above surgical procedure(s). We have discussed the potential benefits and risks of the procedure, side effects of the proposed treatment, the likelihood of the patient achieving the goals of the procedure, and any potential problems that might occur during the procedure or recuperation. Informed consent has been obtained.  Description of procedure:  The patient was taken to the operating room and general anesthesia was induced.  The patient was placed in the dorsal lithotomy position, prepped and draped in the usual sterile fashion, and preoperative antibiotics were administered. A preoperative time-out was performed.   Cystourethroscopy was performed.  The patient's urethra was examined and was normal. The bladder was then systematically examined in its entirety. There was no evidence for any bladder tumors, stones, or other mucosal pathology.    A 0.38 sensor guidewire was then advanced up the left ureter into the renal pelvis under fluoroscopic guidance.  The wire was then backloaded through the cystoscope and a ureteral stent was advance over the wire using Seldinger technique.  The stent was positioned appropriately under fluoroscopic and cystoscopic guidance.  The wire was then removed with an adequate stent curl noted in the renal pelvis as well as in the bladder.  The bladder was then emptied and the procedure ended.  The patient appeared to tolerate the procedure well and without complications.  The  patient was able to be awakened and transferred to the recovery unit in satisfactory condition.    Terry Hall, Jr. MD

## 2017-11-28 NOTE — Anesthesia Preprocedure Evaluation (Addendum)
Anesthesia Evaluation  Patient identified by MRN, date of birth, ID band Patient awake    Reviewed: Allergy & Precautions, NPO status , Patient's Chart, lab work & pertinent test results  Airway Mallampati: I  TM Distance: >3 FB Neck ROM: Full    Dental  (+) Teeth Intact, Dental Advisory Given, Chipped   Pulmonary neg pulmonary ROS,    breath sounds clear to auscultation       Cardiovascular negative cardio ROS   Rhythm:Regular Rate:Tachycardia     Neuro/Psych negative neurological ROS  negative psych ROS   GI/Hepatic negative GI ROS, Neg liver ROS,   Endo/Other  negative endocrine ROSdiabetes, Well Controlled, Type 2  Renal/GU Kidney stone     Musculoskeletal negative musculoskeletal ROS (+)   Abdominal   Peds  Hematology negative hematology ROS (+)   Anesthesia Other Findings   Reproductive/Obstetrics                          Anesthesia Physical Anesthesia Plan  ASA: II and emergent  Anesthesia Plan: General   Post-op Pain Management:    Induction: Intravenous  PONV Risk Score and Plan: 2 and Dexamethasone and Ondansetron  Airway Management Planned:   Additional Equipment:   Intra-op Plan:   Post-operative Plan: Extubation in OR  Informed Consent: I have reviewed the patients History and Physical, chart, labs and discussed the procedure including the risks, benefits and alternatives for the proposed anesthesia with the patient or authorized representative who has indicated his/her understanding and acceptance.   Dental advisory given  Plan Discussed with: CRNA  Anesthesia Plan Comments:        Anesthesia Quick Evaluation

## 2017-11-28 NOTE — Anesthesia Procedure Notes (Signed)
Procedure Name: LMA Insertion Date/Time: 11/28/2017 9:22 PM Performed by: Minerva EndsMirarchi, Jahking Lesser M, CRNA Pre-anesthesia Checklist: Patient identified, Emergency Drugs available, Suction available and Patient being monitored Patient Re-evaluated:Patient Re-evaluated prior to induction Oxygen Delivery Method: Circle System Utilized Preoxygenation: Pre-oxygenation with 100% oxygen Induction Type: IV induction Ventilation: Mask ventilation without difficulty LMA: LMA inserted and LMA with gastric port inserted LMA Size: 3.0 Number of attempts: 1 Placement Confirmation: positive ETCO2 Tube secured with: Tape Dental Injury: Teeth and Oropharynx as per pre-operative assessment  Comments: 20 cc air in LMA-- induction Woodrum-- LMA insertion AM CRNA atraumaic-- teeth and mouth as preop--- bilat BS--- front teeth with chipping prior to LMA

## 2017-11-28 NOTE — ED Triage Notes (Signed)
Transported by [facility] from work--sudden onset of lower back pain that started 1 hr ago. Hx of kidney stones; denies any urinary symptoms. VSS. EMS administered 200 mcg of Fentanyl PTA. Pain currently 5/10. +nausea.

## 2017-11-28 NOTE — ED Provider Notes (Signed)
[facility]-EMERGENCY DEPT Provider Note   CSN: [id number] Arrival date & time: 11/28/17  1400     History   Chief Complaint Chief Complaint  Patient presents with  . Back Pain    HPI Terry Hall is a 62 y.o. male resenting for evaluation of left flank pain.  Patient states that acutely today he developed left flank pain.  It is severe and constant.  Nothing makes it better or worse.  He was given medication with EMS, which mildly improved his pain.  He has associated nausea without vomiting.  He denies fevers, chills, chest pain, shortness of breath, anterior abdominal pain, urinary symptoms, abnormal bowel movements.  Patient reports a history of kidney stones, states this feels similar.  He states he drinks lots of water, he is not sure why he is getting these kidney stones.  Patient denies other medical problems, takes no medications daily.   Per chart review, patient with a history of diabetes.  Had a kidney stone several years ago which is documented in the EMR.  Per records patient brought with him from [place], had a kidney stone in March 2019, with otherwise normal imaging.  HPI  Past Medical History:  Diagnosis Date  . Diabetes mellitus without complication     There are no active problems to display for this patient.     Home Medications    Prior to Admission medications   Medication Sig Start Date End Date Taking? Authorizing Provider  Multiple Vitamin (MULTIVITAMIN WITH MINERALS) TABS tablet Take 1 tablet by mouth daily.   Yes [provider]  HYDROcodone-acetaminophen (NORCO/VICODIN) 5-325 MG tablet Take 1 tablet by mouth every 6 (six) hours as needed for severe pain. 11/28/17   Omaira Mellen, PA-C  ketorolac (TORADOL) 10 MG tablet Take 1 tablet (10 mg total) by mouth every 8 (eight) hours as needed. 11/28/17   Anthonie Lotito, PA-C  ondansetron (ZOFRAN) 4 MG tablet Take 1 tablet (4 mg total) by mouth every 8 (eight) hours as  needed for nausea or vomiting. 11/28/17   Kemuel Buchmann, PA-C  oxyCODONE-acetaminophen (PERCOCET/ROXICET) 5-325 MG per tablet Take 1-2 tablets by mouth every 6 (six) hours as needed for severe pain. Patient not taking: Reported on 11/28/2017 11/19/14   Blake DivineWofford, John, MD  tamsulosin (FLOMAX) 0.4 MG CAPS capsule Take 1 capsule (0.4 mg total) by mouth daily. 11/28/17   Krystale Rinkenberger, PA-C    Family History No family history on file.  Social History Social History   Tobacco Use  . Smoking status: Never Smoker  Substance Use Topics  . Alcohol use: No  . Drug use: No     Allergies   Patient has no known allergies.   Review of Systems Review of Systems  Gastrointestinal: Positive for nausea.  Genitourinary: Positive for flank pain.  All other systems reviewed and are negative.    Physical Exam Updated Vital Signs BP 137/76 (BP Location: Right Arm)   Pulse 81   Temp (!) 97.3 F (36.3 C) (Axillary)   Resp 20   SpO2 100%   Physical Exam  Constitutional: He is oriented to person, place, and time. He appears well-developed and well-nourished. No distress.  Patient is a comfortable due to pain, but in no acute distress  HENT:  Head: Normocephalic and atraumatic.  Eyes: Pupils are equal, round, and reactive to light. Conjunctivae and EOM are normal.  Neck: Normal range of motion. Neck supple.  Cardiovascular: Normal rate, regular rhythm and intact distal pulses.  Pulmonary/Chest: Effort normal and breath sounds normal. No respiratory distress. He has no wheezes.  Abdominal: Soft. He exhibits no distension and no mass. There is no tenderness. There is no rebound and no guarding.  Tenderness palpation of left flank.  No tenderness palpation of anterior abdomen.  Soft without rigidity, guarding, distention.  No masses.  Negative rebound, no signs of peritonitis.  Musculoskeletal: Normal range of motion.  Neurological: He is alert and oriented to person, place, and time.    Skin: Skin is warm and dry. Capillary refill takes less than 2 seconds.  Psychiatric: He has a normal mood and affect.  Nursing note and vitals reviewed.    ED Treatments / Results  Labs (all labs ordered are listed, but only abnormal results are displayed) Labs Reviewed  COMPREHENSIVE METABOLIC PANEL - Abnormal; Notable for the following components:      Result Value   Glucose, Bld 191 (*)    Alkaline Phosphatase 133 (*)    All other components within normal limits  URINE CULTURE  CBC WITH DIFFERENTIAL/PLATELET  LIPASE, BLOOD  URINALYSIS, ROUTINE W REFLEX MICROSCOPIC    EKG None  Radiology No results found.  Procedures Procedures (including critical care time)  Medications Ordered in ED Medications  ketorolac (TORADOL) 15 MG/ML injection 15 mg (15 mg Intravenous Given 11/28/17 1433)  sodium chloride 0.9 % bolus 1,000 mL (1,000 mLs Intravenous New Bag/Given 11/28/17 1434)  ondansetron (ZOFRAN) injection 4 mg (4 mg Intravenous Given 11/28/17 1432)  morphine 4 MG/ML injection 4 mg (4 mg Intravenous Given 11/28/17 1433)     Initial Impression / Assessment and Plan / ED Course  I have reviewed the triage vital signs and the nursing notes.  Pertinent labs & imaging results that were available during my care of the patient were reviewed by me and considered in my medical decision making (see chart for details).     Patient presenting for evaluation of left flank pain.  Initial exam shows patient appears in consulted pain, but no acute distress.  He is afebrile not tachycardic, appears nontoxic patient states this feels similar to previous kidney stones.  Pain mildly improved with Bentyl given by EMS.  Will obtain labs, urine, start fluids, morphine, Toradol, and Zofran for symptom control.  Plan for CT for further evaluation.  On reassessment, patient reports pain and symptoms are much improved with medications.  Labs reassuring, no leukocytosis.  UA pending.  CT  pending.  Patient signed out to A OtsegoMurray, PA-C for follow-up on CT and urine.  Plan for discharge of patient has a kidney stone without complication.  Final Clinical Impressions(s) / ED Diagnoses   Final diagnoses:  Nephrolithiasis    ED Discharge Orders         Ordered    tamsulosin (FLOMAX) 0.4 MG CAPS capsule  Daily     11/28/17 1609    ondansetron (ZOFRAN) 4 MG tablet  Every 8 hours PRN     11/28/17 1609    ketorolac (TORADOL) 10 MG tablet  Every 8 hours PRN     11/28/17 1609    HYDROcodone-acetaminophen (NORCO/VICODIN) 5-325 MG tablet  Every 6 hours PRN     11/28/17 1609           Karem Tomaso, PA-C 11/28/17 1611    Raeford RazorKohut, Stephen, MD 12/04/17 1443

## 2017-11-28 NOTE — H&P (Signed)
Urology History and Physical Note   Admitting Attending: Heloise PurpuraLester Borden, MD Consulting Provider: Alveria ApleySophia Caccavale, PA  Chief Complaint:  Nephrolithiasis  HPI: Terry Hall is a 62 y.o. male with history of nephrolithiasis and a questionable diagnosis of diabetes mellitus who presents with acute onset left sided flank pain with associated nausea/vomiting and chills.  He underwent CT imaging in the ED today which revealed an ~376mm left UVJ stone with associated mild left hydroureteronephrosis. His pain was much improved after IV medication. Urology consulted for assistance with management given UA concerning for infection.  He denies fevers, but does report chills. He does report dysuria as well as urgency/frequency. He denies prior history of UTIs. He does report for instances of renal colic associated with kidney stones, but has never undergone stone surgery before.   Past Medical History: Past Medical History:  Diagnosis Date  . Diabetes mellitus without complication     Past Surgical History:  The histories are not reviewed yet. Please review them in the "History" navigator section and refresh this SmartLink.   Medication: Current Facility-Administered Medications  Medication Dose Route Frequency Provider Last Rate Last Dose  . cefTRIAXone (ROCEPHIN) 1 g in sodium chloride 0.9 % 100 mL IVPB  1 g Intravenous Once Murray, Alyssa B, PA-C      . morphine 4 MG/ML injection 4 mg  4 mg Intravenous Once Aviva KluverMurray, Alyssa B, PA-C       Current Outpatient Medications  Medication Sig Dispense Refill  . Multiple Vitamin (MULTIVITAMIN WITH MINERALS) TABS tablet Take 1 tablet by mouth daily.    Marland Kitchen. HYDROcodone-acetaminophen (NORCO/VICODIN) 5-325 MG tablet Take 1 tablet by mouth every 6 (six) hours as needed for severe pain. 8 tablet 0  . ketorolac (TORADOL) 10 MG tablet Take 1 tablet (10 mg total) by mouth every 8 (eight) hours as needed. 20 tablet 0  . ondansetron (ZOFRAN) 4 MG tablet Take 1 tablet  (4 mg total) by mouth every 8 (eight) hours as needed for nausea or vomiting. 12 tablet 0  . oxyCODONE-acetaminophen (PERCOCET/ROXICET) 5-325 MG per tablet Take 1-2 tablets by mouth every 6 (six) hours as needed for severe pain. (Patient not taking: Reported on 11/28/2017) 15 tablet 0  . tamsulosin (FLOMAX) 0.4 MG CAPS capsule Take 1 capsule (0.4 mg total) by mouth daily. 14 capsule 0    Allergies: No Known Allergies  Social History: Social History   Tobacco Use  . Smoking status: Never Smoker  Substance Use Topics  . Alcohol use: No  . Drug use: No    Family History No family history on file.  Review of Systems 10 systems were reviewed and are negative except as noted specifically in the HPI.  Objective   Vital signs in last 24 hours: BP 128/81   Pulse 63   Temp (!) 97.3 F (36.3 C) (Axillary)   Resp (!) 21   SpO2 100%   Physical Exam General: NAD, A&O, resting, appropriate HEENT: [place]/AT, EOMI, MMM Pulmonary: Normal work of breathing Cardiovascular: HDS, adequate peripheral perfusion Abdomen: Soft, NTTP, nondistended, midline incision from prior ex lap GU: No CVA tenderness Extremities: warm and well perfused Neuro: Appropriate, no focal neurological deficits  Most Recent Labs: Lab Results  Component Value Date   WBC 6.1 11/28/2017   HGB 14.5 11/28/2017   HCT 42.0 11/28/2017   PLT 330 11/28/2017    Lab Results  Component Value Date   NA 142 11/28/2017   K 3.5 11/28/2017   CL 105 11/28/2017  CO2 23 11/28/2017   BUN 17 11/28/2017   CREATININE 1.05 11/28/2017   CALCIUM 9.5 11/28/2017    No results found for: INR, APTT   IMAGING: Ct Renal Stone Study  Result Date: 11/28/2017 CLINICAL DATA:  Acute lower back pain. EXAM: CT ABDOMEN AND PELVIS WITHOUT CONTRAST TECHNIQUE: Multidetector CT imaging of the abdomen and pelvis was performed following the standard protocol without IV contrast. COMPARISON:  CT scan of November 19, 2014. FINDINGS: Lower chest: No  acute abnormality. Hepatobiliary: No focal liver abnormality is seen. No gallstones, gallbladder wall thickening, or biliary dilatation. Pancreas: Unremarkable. No pancreatic ductal dilatation or surrounding inflammatory changes. Spleen: Normal in size without focal abnormality. Adrenals/Urinary Tract: Adrenal glands appear normal. Bilateral nephrolithiasis is noted. Mild left hydroureteronephrosis is noted secondary to 6 mm calculus at the left ureterovesical junction. Urinary bladder is unremarkable. Stomach/Bowel: Stomach is within normal limits. Appendix appears normal. No evidence of bowel wall thickening, distention, or inflammatory changes. Vascular/Lymphatic: No significant vascular findings are present. No enlarged abdominal or pelvic lymph nodes. Reproductive: Prostate is unremarkable. Other: No abdominal wall hernia or abnormality. No abdominopelvic ascites. Musculoskeletal: No acute or significant osseous findings. IMPRESSION: Bilateral nephrolithiasis. Mild left hydroureteronephrosis is noted secondary to 6 mm calculus at the left ureterovesical junction. Electronically Signed   By: Lupita RaiderJames  Green Jr, M.D.   On: 11/28/2017 16:24    ------  Assessment:   62 y.o. male with history of nephrolithiasis and questionable diagnosis of DM who is presenting with obstructing 6mm left UVJ.   Patient afebrile and HDS. No leukocytosis. UA with trace LE, positive nitrites, 11-20 WBCs, many bacteria. He does report dysuria as well as urgency/freqyency. UCx pending. Cr WNL. Pain significantly improved with medication.   Discussed options with patient that include left ureteral stent placement versus trial of passage with close observation. We discussed the risks and benefits of each option in detail. We feel that placement of left ureteral stent is most appropriate given concern for possible infection in his urinary tract. Patient is in agreement and wishes to move forward as planned.     Plan: - OR for  placement of left ureteral stent - Admit patient for overnight observation with plans to continue empiric coverage with ceftriaxone - We will defer placement definitive stone treatment to a later date

## 2017-11-28 NOTE — ED Notes (Signed)
Bed: WA12 Expected date:  Expected time:  Means of arrival:  Comments: EMS-back pain 

## 2017-11-29 ENCOUNTER — Other Ambulatory Visit: Payer: Self-pay

## 2017-11-29 ENCOUNTER — Encounter (HOSPITAL_COMMUNITY): Payer: Self-pay

## 2017-11-29 LAB — CBC
HCT: 39 % (ref 39.0–52.0)
Hemoglobin: 13 g/dL (ref 13.0–17.0)
MCH: 28.6 pg (ref 26.0–34.0)
MCHC: 33.3 g/dL (ref 30.0–36.0)
MCV: 85.9 fL (ref 78.0–100.0)
Platelets: 279 10*3/uL (ref 150–400)
RBC: 4.54 MIL/uL (ref 4.22–5.81)
RDW: 13.5 % (ref 11.5–15.5)
WBC: 11.4 10*3/uL — ABNORMAL HIGH (ref 4.0–10.5)

## 2017-11-29 LAB — BASIC METABOLIC PANEL
Anion gap: 7 (ref 5–15)
BUN: 14 mg/dL (ref 6–20)
CO2: 24 mmol/L (ref 22–32)
Calcium: 8 mg/dL — ABNORMAL LOW (ref 8.9–10.3)
Chloride: 109 mmol/L (ref 98–111)
Creatinine, Ser: 1.01 mg/dL (ref 0.61–1.24)
GFR calc Af Amer: >60 mL/min (ref >60)
GFR calc non Af Amer: >60 mL/min (ref >60)
Glucose, Bld: 209 mg/dL — ABNORMAL HIGH (ref 70–99)
Potassium: 4.1 mmol/L (ref 3.5–5.1)
Sodium: 140 mmol/L (ref 135–145)

## 2017-11-29 MED ORDER — PHENYLEPHRINE 40 MCG/ML (10ML) SYRINGE FOR IV PUSH (FOR BLOOD PRESSURE SUPPORT)
PREFILLED_SYRINGE | INTRAVENOUS | Status: AC
  Filled 2017-11-29: qty 20

## 2017-11-29 MED ORDER — SULFAMETHOXAZOLE-TRIMETHOPRIM 800-160 MG PO TABS: 1.0000 | ORAL_TABLET | Freq: Two times a day (BID) | ORAL | 0 refills | Status: DC

## 2017-11-29 MED ORDER — HYDROCODONE-ACETAMINOPHEN 5-325 MG PO TABS: 1.0000 | ORAL_TABLET | Freq: Four times a day (QID) | ORAL | 0 refills | Status: DC | PRN

## 2017-11-29 MED ORDER — DEXAMETHASONE SODIUM PHOSPHATE 10 MG/ML IJ SOLN
INTRAMUSCULAR | Status: AC
  Filled 2017-11-29: qty 1

## 2017-11-29 MED ORDER — EPHEDRINE 5 MG/ML INJ
INTRAVENOUS | Status: AC
  Filled 2017-11-29: qty 20

## 2017-11-29 MED ORDER — ROCURONIUM BROMIDE 10 MG/ML (PF) SYRINGE
PREFILLED_SYRINGE | INTRAVENOUS | Status: AC
  Filled 2017-11-29: qty 20

## 2017-11-29 MED ORDER — LIDOCAINE 2% (20 MG/ML) 5 ML SYRINGE
INTRAMUSCULAR | Status: AC
  Filled 2017-11-29: qty 5

## 2017-11-29 MED ORDER — SUGAMMADEX SODIUM 200 MG/2ML IV SOLN
INTRAVENOUS | Status: AC
  Filled 2017-11-29: qty 2

## 2017-11-29 MED ORDER — SUCCINYLCHOLINE CHLORIDE 200 MG/10ML IV SOSY
PREFILLED_SYRINGE | INTRAVENOUS | Status: AC
  Filled 2017-11-29: qty 20

## 2017-11-29 MED ORDER — ONDANSETRON HCL 4 MG/2ML IJ SOLN
INTRAMUSCULAR | Status: AC
  Filled 2017-11-29: qty 2

## 2017-11-29 NOTE — Progress Notes (Signed)
Patient ID: Terry Hall, male   DOB: 09/28/1955, 62 y.o.   MRN: [id number]  1 Day Post-Op Subjective: Pt feeling improved.  Some pain in flank when voiding as expected but otherwise pain well controlled with oral medication.  No fever overnight.  Objective: Vital signs in last 24 hours: Temp:  [97.3 F (36.3 C)-99.9 F (37.7 C)] 98.1 F (36.7 C) (08/22 0522) Pulse Rate:  [57-94] 59 (08/22 0522) Resp:  [16-21] 16 (08/22 0522) BP: (102-152)/(62-100) 102/66 (08/22 0522) SpO2:  [95 %-100 %] 100 % (08/22 0522) Weight:  [61.4 kg] 61.4 kg (08/21 2300)  Intake/Output from previous day: 08/21 0701 - 08/22 0700 In: 1917.3 [I.V.:917.3; IV Piggyback:1000] Out: 700 [Urine:700] Intake/Output this shift: No intake/output data recorded.  Physical Exam:  General: Alert and oriented Abdomen: Soft, ND, Minimal L CVAT   Lab Results: Recent Labs    11/28/17 1420 11/29/17 0534  HGB 14.5 13.0  HCT 42.0 39.0   CBC Latest Ref Rng & Units 11/29/2017 11/28/2017 11/19/2014  WBC 4.0 - 10.5 K/uL 11.4(H) 6.1 8.6  Hemoglobin 13.0 - 17.0 g/dL 40.913.0 81.114.5 91.414.4  Hematocrit 39.0 - 52.0 % 39.0 42.0 42.8  Platelets 150 - 400 K/uL 279 330 317     BMET Recent Labs    11/28/17 1420 11/29/17 0534  NA 142 140  K 3.5 4.1  CL 105 109  CO2 23 24  GLUCOSE 191* 209*  BUN 17 14  CREATININE 1.05 1.01  CALCIUM 9.5 8.0*   Urine culture pending  Studies/Results: Dg C-arm 1-60 Min-no Report  Result Date: 11/28/2017 Fluoroscopy was utilized by the requesting physician.  No radiographic interpretation.   Ct Renal Stone Study  Result Date: 11/28/2017 CLINICAL DATA:  Acute lower back pain. EXAM: CT ABDOMEN AND PELVIS WITHOUT CONTRAST TECHNIQUE: Multidetector CT imaging of the abdomen and pelvis was performed following the standard protocol without IV contrast. COMPARISON:  CT scan of November 19, 2014. FINDINGS: Lower chest: No acute abnormality. Hepatobiliary: No focal liver abnormality is seen. No gallstones,  gallbladder wall thickening, or biliary dilatation. Pancreas: Unremarkable. No pancreatic ductal dilatation or surrounding inflammatory changes. Spleen: Normal in size without focal abnormality. Adrenals/Urinary Tract: Adrenal glands appear normal. Bilateral nephrolithiasis is noted. Mild left hydroureteronephrosis is noted secondary to 6 mm calculus at the left ureterovesical junction. Urinary bladder is unremarkable. Stomach/Bowel: Stomach is within normal limits. Appendix appears normal. No evidence of bowel wall thickening, distention, or inflammatory changes. Vascular/Lymphatic: No significant vascular findings are present. No enlarged abdominal or pelvic lymph nodes. Reproductive: Prostate is unremarkable. Other: No abdominal wall hernia or abnormality. No abdominopelvic ascites. Musculoskeletal: No acute or significant osseous findings. IMPRESSION: Bilateral nephrolithiasis. Mild left hydroureteronephrosis is noted secondary to 6 mm calculus at the left ureterovesical junction. Electronically Signed   By: Lupita RaiderJames  Green Jr, M.D.   On: 11/28/2017 16:24    Assessment/Plan: Left ureteral stone with UTI:  Patient has remained hemodynamically stable overnight with no fever.  Will d/c home with empiric antibiotic therapy and adjust based on final culture results.  We have discussed proceeding with definitive treatment with ureteroscopic laser lithotripsy after resolution of his infection.  I discussed the potential benefits and risks of the procedure, the alternatives, side effects of the proposed treatment, the likelihood of the patient achieving the goals of the procedure, and any potential problems that might occur during the procedure or recuperation. He gives informed consent to proceed and will plan to follow up in the office as an outpatient prior to  his procedure to ensure resolution of his current infection.   LOS: 0 days   Jalia Zuniga,LES 11/29/2017, 7:06 AM

## 2017-11-29 NOTE — Anesthesia Postprocedure Evaluation (Signed)
Anesthesia Post Note  Patient: Terry Hall  Procedure(s) Performed: CYSTOSCOPY WITH STENT PLACEMENT (Left )     Patient location during evaluation: PACU Anesthesia Type: General Level of consciousness: awake and alert Pain management: pain level controlled Vital Signs Assessment: post-procedure vital signs reviewed and stable Respiratory status: spontaneous breathing, nonlabored ventilation, respiratory function stable and patient connected to nasal cannula oxygen Cardiovascular status: blood pressure returned to baseline and stable Postop Assessment: no apparent nausea or vomiting Anesthetic complications: no    Last Vitals:  Vitals:   11/29/17 0210 11/29/17 0522  BP: 115/62 102/66  Pulse: 63 (!) 59  Resp: 16 16  Temp: 36.9 C 36.7 C  SpO2: 97% 100%    Last Pain:  Vitals:   11/29/17 0935  TempSrc:   PainSc: 0-No pain                 Nakiya Rallis L Haylen Bellotti

## 2017-11-29 NOTE — Discharge Summary (Signed)
  Date of admission: 11/28/2017  Date of discharge: 11/29/2017  Admission diagnosis: Obstructing nephrolithiasis  Discharge diagnosis: Obstructing nephrolithiasis  Secondary diagnoses: UTI  History and Physical: For full details, please see admission history and physical. Briefly, Terry Hall is a 62 y.o. year old patient with history of nephrolithiasis presenting on 8/21 with acute onset left sided flank pain in setting of obstructing 6mm left UVJ stone and concern for UTI  Hospital Course: Patient received empiric antibiotic coverage with ceftriaxone and then underwent cystourethroscopy with placement of left ureteral stent on 8/21. He tolerated the procedure well and was taken to the PACU for routine post-operative recovery. He was later transferred to the floor where he did well overnight. By the morning of POD#1, patient was meeting discharge criteria as his pain was well controlled and he was tolerating a regular diet. He will complete a treatment course of Bactrim pending final urine culture results.  Laboratory values:  Recent Labs    11/28/17 1420 11/29/17 0534  HGB 14.5 13.0  HCT 42.0 39.0   Recent Labs    11/28/17 1420 11/29/17 0534  CREATININE 1.05 1.01    Disposition: Home  Discharge instruction: The patient was instructed to continue empiric antibiotic course and will follow up for pre-op appointment prior to ureteroscopic stone extraction.  Discharge medications:  Allergies as of 11/29/2017   No Known Allergies     Medication List    STOP taking these medications   multivitamin with minerals Tabs tablet   oxyCODONE-acetaminophen 5-325 MG tablet Commonly known as:  PERCOCET/ROXICET   tamsulosin 0.4 MG Caps capsule Commonly known as:  FLOMAX     TAKE these medications   HYDROcodone-acetaminophen 5-325 MG tablet Commonly known as:  NORCO/VICODIN Take 1-2 tablets by mouth every 6 (six) hours as needed for severe pain.   ondansetron 4 MG tablet Commonly  known as:  ZOFRAN Take 1 tablet (4 mg total) by mouth every 8 (eight) hours as needed for nausea or vomiting.   sulfamethoxazole-trimethoprim 800-160 MG tablet Commonly known as:  BACTRIM DS,SEPTRA DS Take 1 tablet by mouth 2 (two) times daily.       Followup:  Follow-up Information    [facility] Follow up.   Why:  Will call to schedule appointment to re-check urine specimen before planned surgery.  Our office will call to schedule your next surgery as well. Contact information: [address] [place] [id number] [phone]

## 2017-11-30 LAB — HIV ANTIBODY (ROUTINE TESTING W REFLEX): HIV Screen 4th Generation wRfx: NONREACTIVE

## 2017-12-01 LAB — URINE CULTURE: Culture: >=100000 — AB

## 2017-12-04 ENCOUNTER — Other Ambulatory Visit: Payer: Self-pay | Admitting: Urology

## 2017-12-11 NOTE — Patient Instructions (Addendum)
Penn Kevontay  12/11/2017   Your procedure is scheduled on: 12-17-17   Report to [facility] Main  Entrance    Report to Admitting at 1:45 PM    Call this number if you have problems the morning of surgery [phone]     Remember: Do not eat food or drink liquids :After Midnight. You may have a Clear Liquid Diet from Midnight until 9:45 AM. After 9:45 AM, nothing until after surgery.     CLEAR LIQUID DIET   Foods Allowed                                                                     Foods Excluded  Coffee and tea, regular and decaf                             liquids that you cannot  Plain Jell-O in any flavor                                             see through such as: Fruit ices (not with fruit pulp)                                     milk, soups, orange juice  Iced Popsicles                                    All solid food Carbonated beverages, regular and diet                                    Cranberry, grape and apple juices Sports drinks like Gatorade Lightly seasoned clear broth or consume(fat free) Sugar, honey syrup  Sample Menu Breakfast                                Lunch                                     Supper Cranberry juice                    Beef broth                            Chicken broth Jell-O                                     Grape juice  Apple juice Coffee or tea                        Jell-O                                      Popsicle                                                Coffee or tea                        Coffee or tea  _____________________________________________________________________     Take these medicines the morning of surgery with A SIP OF WATER: None- per pt's preference                                You may not have any metal on your body including hair pins and              piercings  Do not wear jewelry, lotion, cologne, powders, or deodorant  Men may shave face and neck.   Do not bring valuables to the hospital. [place] IS NOT             RESPONSIBLE   FOR VALUABLES.  Contacts, dentures or bridgework may not be worn into surgery.      Patients discharged the day of surgery will not be allowed to drive home.  Name and phone number of your driver: Bishop Limbo [phone]                Please read over the following fact sheets you were given: _____________________________________________________________________             [facility] - Preparing for Surgery Before surgery, you can play an important role.  Because skin is not sterile, your skin needs to be as free of germs as possible.  You can reduce the number of germs on your skin by washing with CHG (chlorahexidine gluconate) soap before surgery.  CHG is an antiseptic cleaner which kills germs and bonds with the skin to continue killing germs even after washing. Please DO NOT use if you have an allergy to CHG or antibacterial soaps.  If your skin becomes reddened/irritated stop using the CHG and inform your nurse when you arrive at Short Stay. Do not shave (including legs and underarms) for at least 48 hours prior to the first CHG shower.  You may shave your face/neck. Please follow these instructions carefully:  1.  Shower with CHG Soap the night before surgery and the  morning of Surgery.  2.  If you choose to wash your hair, wash your hair first as usual with your  normal  shampoo.  3.  After you shampoo, rinse your hair and body thoroughly to remove the  shampoo.                           4.  Use CHG as you would any other liquid soap.  You can apply chg directly  to the skin and wash  Gently with a scrungie or clean washcloth.  5.  Apply the CHG Soap to your body ONLY FROM THE NECK DOWN.   Do not use on face/ open                           Wound or open sores. Avoid contact with eyes, ears mouth and genitals (private parts).                        Wash face,  Genitals (private parts) with your normal soap.             6.  Wash thoroughly, paying special attention to the area where your surgery  will be performed.  7.  Thoroughly rinse your body with warm water from the neck down.  8.  DO NOT shower/wash with your normal soap after using and rinsing off  the CHG Soap.                9.  Pat yourself dry with a clean towel.            10.  Wear clean pajamas.            11.  Place clean sheets on your bed the night of your first shower and do not  sleep with pets. Day of Surgery : Do not apply any lotions/deodorants the morning of surgery.  Please wear clean clothes to the hospital/surgery center.  FAILURE TO FOLLOW THESE INSTRUCTIONS MAY RESULT IN THE CANCELLATION OF YOUR SURGERY PATIENT SIGNATURE_________________________________  NURSE SIGNATURE__________________________________  ________________________________________________________________________

## 2017-12-13 ENCOUNTER — Encounter (HOSPITAL_COMMUNITY)
Admission: RE | Admit: 2017-12-13 | Discharge: 2017-12-13 | Disposition: A | Discharge status: Home / Self Care | Payer: BLUE CROSS/BLUE SHIELD | Source: Ambulatory Visit | Attending: Urology | Admitting: Urology

## 2017-12-13 ENCOUNTER — Encounter (HOSPITAL_COMMUNITY): Payer: Self-pay

## 2017-12-13 ENCOUNTER — Other Ambulatory Visit: Payer: Self-pay

## 2017-12-13 DIAGNOSIS — N201 Calculus of ureter: Secondary | ICD-10-CM | POA: Diagnosis not present

## 2017-12-13 DIAGNOSIS — Z01812 Encounter for preprocedural laboratory examination: Secondary | ICD-10-CM | POA: Diagnosis present

## 2017-12-13 LAB — BASIC METABOLIC PANEL
Anion gap: 8 (ref 5–15)
BUN: 20 mg/dL (ref 6–20)
CALCIUM: 9.9 mg/dL (ref 8.9–10.3)
CHLORIDE: 106 mmol/L (ref 98–111)
CO2: 27 mmol/L (ref 22–32)
CREATININE: 1 mg/dL (ref 0.61–1.24)
GFR calc non Af Amer: >60 mL/min (ref >60)
Glucose, Bld: 122 mg/dL — ABNORMAL HIGH (ref 70–99)
Potassium: 4.3 mmol/L (ref 3.5–5.1)
SODIUM: 141 mmol/L (ref 135–145)

## 2017-12-13 LAB — CBC
HCT: 42.7 % (ref 39.0–52.0)
Hemoglobin: 14.5 g/dL (ref 13.0–17.0)
MCH: 29 pg (ref 26.0–34.0)
MCHC: 34 g/dL (ref 30.0–36.0)
MCV: 85.4 fL (ref 78.0–100.0)
PLATELETS: 334 10*3/uL (ref 150–400)
RBC: 5 MIL/uL (ref 4.22–5.81)
RDW: 12.8 % (ref 11.5–15.5)
WBC: 4.2 10*3/uL (ref 4.0–10.5)

## 2017-12-13 LAB — HEMOGLOBIN A1C
HEMOGLOBIN A1C: 6.6 % — AB (ref 4.8–5.6)
Mean Plasma Glucose: 142.72 mg/dL

## 2017-12-17 ENCOUNTER — Ambulatory Visit (HOSPITAL_COMMUNITY)
Admission: RE | Admit: 2017-12-17 | Discharge: 2017-12-17 | Disposition: A | Discharge status: Home / Self Care | Payer: BLUE CROSS/BLUE SHIELD | Source: Ambulatory Visit | Attending: Urology | Admitting: Urology

## 2017-12-17 ENCOUNTER — Ambulatory Visit (HOSPITAL_COMMUNITY): Payer: BLUE CROSS/BLUE SHIELD | Admitting: Certified Registered Nurse Anesthetist

## 2017-12-17 ENCOUNTER — Encounter (HOSPITAL_COMMUNITY)
Admission: RE | Disposition: A | Discharge status: Home / Self Care | Payer: Self-pay | Source: Ambulatory Visit | Attending: Urology

## 2017-12-17 ENCOUNTER — Encounter (HOSPITAL_COMMUNITY): Payer: Self-pay | Admitting: Certified Registered Nurse Anesthetist

## 2017-12-17 ENCOUNTER — Ambulatory Visit (HOSPITAL_COMMUNITY): Payer: BLUE CROSS/BLUE SHIELD

## 2017-12-17 DIAGNOSIS — N201 Calculus of ureter: Secondary | ICD-10-CM | POA: Insufficient documentation

## 2017-12-17 DIAGNOSIS — Z87891 Personal history of nicotine dependence: Secondary | ICD-10-CM | POA: Insufficient documentation

## 2017-12-17 DIAGNOSIS — E119 Type 2 diabetes mellitus without complications: Secondary | ICD-10-CM | POA: Insufficient documentation

## 2017-12-17 HISTORY — PX: CYSTOSCOPY/URETEROSCOPY/HOLMIUM LASER/STENT PLACEMENT: SHX6546

## 2017-12-17 LAB — GLUCOSE, CAPILLARY: GLUCOSE-CAPILLARY: 87 mg/dL (ref 70–99)

## 2017-12-17 SURGERY — CYSTOSCOPY/URETEROSCOPY/HOLMIUM LASER/STENT PLACEMENT
Anesthesia: General | Laterality: Left

## 2017-12-17 MED ORDER — LIDOCAINE 2% (20 MG/ML) 5 ML SYRINGE
INTRAMUSCULAR | Status: AC
  Filled 2017-12-17: qty 5

## 2017-12-17 MED ORDER — DEXAMETHASONE SODIUM PHOSPHATE 10 MG/ML IJ SOLN
INTRAMUSCULAR | Status: AC
  Filled 2017-12-17: qty 1

## 2017-12-17 MED ORDER — DEXAMETHASONE SODIUM PHOSPHATE 10 MG/ML IJ SOLN
INTRAMUSCULAR | Status: DC | PRN
  Administered 2017-12-17: 10 mg via INTRAVENOUS

## 2017-12-17 MED ORDER — EPHEDRINE 5 MG/ML INJ
INTRAVENOUS | Status: AC
  Filled 2017-12-17: qty 10

## 2017-12-17 MED ORDER — PROMETHAZINE HCL 25 MG/ML IJ SOLN: 6.2500 mg | INTRAMUSCULAR | Status: DC | PRN

## 2017-12-17 MED ORDER — LIDOCAINE 2% (20 MG/ML) 5 ML SYRINGE
INTRAMUSCULAR | Status: DC | PRN
  Administered 2017-12-17: 60 mg via INTRAVENOUS

## 2017-12-17 MED ORDER — ONDANSETRON HCL 4 MG/2ML IJ SOLN
INTRAMUSCULAR | Status: DC | PRN
  Administered 2017-12-17: 4 mg via INTRAVENOUS

## 2017-12-17 MED ORDER — SODIUM CHLORIDE 0.9 % IV SOLN
2.0000 g | Freq: Once | INTRAVENOUS | Status: AC
  Administered 2017-12-17: 2 g via INTRAVENOUS
  Filled 2017-12-17: qty 20

## 2017-12-17 MED ORDER — ONDANSETRON HCL 4 MG/2ML IJ SOLN
INTRAMUSCULAR | Status: AC
  Filled 2017-12-17: qty 2

## 2017-12-17 MED ORDER — MIDAZOLAM HCL 5 MG/5ML IJ SOLN
INTRAMUSCULAR | Status: DC | PRN
  Administered 2017-12-17: 2 mg via INTRAVENOUS

## 2017-12-17 MED ORDER — PROPOFOL 10 MG/ML IV BOLUS
INTRAVENOUS | Status: DC | PRN
  Administered 2017-12-17: 100 mg via INTRAVENOUS

## 2017-12-17 MED ORDER — SODIUM CHLORIDE 0.9 % IR SOLN
Status: DC | PRN
  Administered 2017-12-17: 6000 mL

## 2017-12-17 MED ORDER — 0.9 % SODIUM CHLORIDE (POUR BTL) OPTIME
TOPICAL | Status: DC | PRN
  Administered 2017-12-17: 1000 mL

## 2017-12-17 MED ORDER — KETOROLAC TROMETHAMINE 30 MG/ML IJ SOLN: 30.0000 mg | Freq: Once | INTRAMUSCULAR | Status: DC | PRN

## 2017-12-17 MED ORDER — FENTANYL CITRATE (PF) 100 MCG/2ML IJ SOLN
INTRAMUSCULAR | Status: DC | PRN
  Administered 2017-12-17 (×2): 25 ug via INTRAVENOUS
  Administered 2017-12-17: 50 ug via INTRAVENOUS

## 2017-12-17 MED ORDER — PROPOFOL 10 MG/ML IV BOLUS
INTRAVENOUS | Status: AC
  Filled 2017-12-17: qty 20

## 2017-12-17 MED ORDER — HYDROCODONE-ACETAMINOPHEN 5-325 MG PO TABS
ORAL_TABLET | ORAL | Status: AC
  Filled 2017-12-17: qty 1

## 2017-12-17 MED ORDER — MIDAZOLAM HCL 2 MG/2ML IJ SOLN
INTRAMUSCULAR | Status: AC
  Filled 2017-12-17: qty 2

## 2017-12-17 MED ORDER — FENTANYL CITRATE (PF) 100 MCG/2ML IJ SOLN
INTRAMUSCULAR | Status: AC
  Filled 2017-12-17: qty 2

## 2017-12-17 MED ORDER — EPHEDRINE SULFATE-NACL 50-0.9 MG/10ML-% IV SOSY
PREFILLED_SYRINGE | INTRAVENOUS | Status: DC | PRN
  Administered 2017-12-17: 10 mg via INTRAVENOUS

## 2017-12-17 MED ORDER — FENTANYL CITRATE (PF) 100 MCG/2ML IJ SOLN: 25.0000 ug | INTRAMUSCULAR | Status: DC | PRN

## 2017-12-17 MED ORDER — HYDROCODONE-ACETAMINOPHEN 5-325 MG PO TABS
1.0000 | ORAL_TABLET | ORAL | Status: AC | PRN
  Administered 2017-12-17: 1 via ORAL

## 2017-12-17 MED ORDER — LACTATED RINGERS IV SOLN
INTRAVENOUS | Status: DC
  Administered 2017-12-17 (×2): via INTRAVENOUS

## 2017-12-17 MED ORDER — HYDROCODONE-ACETAMINOPHEN 5-325 MG PO TABS: 1.0000 | ORAL_TABLET | Freq: Four times a day (QID) | ORAL | 0 refills | Status: DC | PRN

## 2017-12-17 SURGICAL SUPPLY — 18 items
BAG URO CATCHER STRL LF (MISCELLANEOUS) ×3 IMPLANT
BASKET ZERO TIP NITINOL 2.4FR (BASKET) ×3 IMPLANT
CATH INTERMIT  6FR 70CM (CATHETERS) ×3 IMPLANT
CLOTH BEACON ORANGE TIMEOUT ST (SAFETY) ×3 IMPLANT
FIBER LASER FLEXIVA 365 (UROLOGICAL SUPPLIES) IMPLANT
FIBER LASER TRAC TIP (UROLOGICAL SUPPLIES) ×3 IMPLANT
GLOVE BIOGEL M STRL SZ7.5 (GLOVE) ×3 IMPLANT
GOWN STRL REUS W/TWL LRG LVL3 (GOWN DISPOSABLE) ×6 IMPLANT
GUIDEWIRE ANG ZIPWIRE 038X150 (WIRE) IMPLANT
GUIDEWIRE STR DUAL SENSOR (WIRE) ×3 IMPLANT
IV NS 1000ML (IV SOLUTION) ×2
IV NS 1000ML BAXH (IV SOLUTION) ×1 IMPLANT
MANIFOLD NEPTUNE II (INSTRUMENTS) ×3 IMPLANT
PACK CYSTO (CUSTOM PROCEDURE TRAY) ×3 IMPLANT
SHEATH URETERAL 12FRX35CM (MISCELLANEOUS) IMPLANT
TUBING CONNECTING 10 (TUBING) ×2 IMPLANT
TUBING CONNECTING 10' (TUBING) ×1
TUBING UROLOGY SET (TUBING) ×3 IMPLANT

## 2017-12-17 NOTE — Op Note (Signed)
Preoperative diagnosis: Left ureteral calculus  Postoperative diagnosis: Left ureteral calculus  Procedure:  1. Cystoscopy 2. Left ureteroscopy and stone removal 3. Ureteroscopic laser lithotripsy  Surgeon: Moody Bruins. M.D.  Anesthesia: General  Complications: None  EBL: Minimal  Specimens: 1. Left ureteral calculus  Disposition of specimens: [facility] for stone analysis  Indication: Terry Hall is a 62 y.o. year old patient with urolithiasis. He recently had a left distal ureteral stone and infection and underwent ureteral stenting. He has completed antibiotics and his recent culture is negative. After reviewing the management options for treatment, the patient elected to proceed with the above surgical procedure(s). We have discussed the potential benefits and risks of the procedure, side effects of the proposed treatment, the likelihood of the patient achieving the goals of the procedure, and any potential problems that might occur during the procedure or recuperation. Informed consent has been obtained.  Description of procedure:  The patient was taken to the operating room and general anesthesia was induced.  The patient was placed in the dorsal lithotomy position, prepped and draped in the usual sterile fashion, and preoperative antibiotics were administered. A preoperative time-out was performed.   Cystourethroscopy was performed.  The patient's urethra was examined and was normal. The bladder was then systematically examined in its entirety. There was no evidence for any bladder tumors, stones, or other mucosal pathology.    Attention then turned to the left ureteral orifice.  A 0.38 sensor guidewire was then advanced up the left ureter into the renal pelvis under fluoroscopic guidance. The 6 Fr semirigid ureteroscope was then advanced into the ureter next to the guidewire and the calculus was identified.   The stone was then fragmented with the  365 micron holmium laser fiber on a setting of 0.6 J and frequency of 6 Hz.   All stones were then removed from the ureter with a zero tip nitinol basket.  Reinspection of the ureter revealed no remaining visible stones or fragments.   The bladder was then emptied and the procedure ended.  The patient appeared to tolerate the procedure well and without complications.  The patient was able to be awakened and transferred to the recovery unit in satisfactory condition.

## 2017-12-17 NOTE — Anesthesia Preprocedure Evaluation (Signed)
Anesthesia Evaluation  Patient identified by MRN, date of birth, ID band Patient awake    Reviewed: Allergy & Precautions, NPO status , Patient's Chart, lab work & pertinent test results  Airway Mallampati: II  TM Distance: >3 FB Neck ROM: Full    Dental no notable dental hx.    Pulmonary neg pulmonary ROS,    Pulmonary exam normal breath sounds clear to auscultation       Cardiovascular negative cardio ROS Normal cardiovascular exam Rhythm:Regular Rate:Normal     Neuro/Psych negative neurological ROS  negative psych ROS   GI/Hepatic negative GI ROS, Neg liver ROS,   Endo/Other  diabetes  Renal/GU negative Renal ROS  negative genitourinary   Musculoskeletal negative musculoskeletal ROS (+)   Abdominal   Peds negative pediatric ROS (+)  Hematology negative hematology ROS (+)   Anesthesia Other Findings   Reproductive/Obstetrics negative OB ROS                             Anesthesia Physical Anesthesia Plan  ASA: II  Anesthesia Plan: General   Post-op Pain Management:    Induction: Intravenous  PONV Risk Score and Plan: Ondansetron, Dexamethasone and Treatment may vary due to age or medical condition  Airway Management Planned: LMA  Additional Equipment:   Intra-op Plan:   Post-operative Plan: Extubation in OR  Informed Consent: I have reviewed the patients History and Physical, chart, labs and discussed the procedure including the risks, benefits and alternatives for the proposed anesthesia with the patient or authorized representative who has indicated his/her understanding and acceptance.   Dental advisory given  Plan Discussed with: CRNA and Surgeon  Anesthesia Plan Comments:         Anesthesia Quick Evaluation

## 2017-12-17 NOTE — Anesthesia Procedure Notes (Signed)
Procedure Name: LMA Insertion Date/Time: 12/17/2017 3:51 PM Performed by: Pearson Grippe, CRNA Pre-anesthesia Checklist: Patient identified, Emergency Drugs available, Suction available and Patient being monitored Patient Re-evaluated:Patient Re-evaluated prior to induction Oxygen Delivery Method: Circle system utilized Preoxygenation: Pre-oxygenation with 100% oxygen Induction Type: IV induction Ventilation: Mask ventilation without difficulty LMA: LMA inserted LMA Size: 3.0 Number of attempts: 1 Placement Confirmation: positive ETCO2 Tube secured with: Tape Dental Injury: Teeth and Oropharynx as per pre-operative assessment

## 2017-12-17 NOTE — Transfer of Care (Signed)
Immediate Anesthesia Transfer of Care Note  Patient: Terry Hall  Procedure(s) Performed: CYSTOSCOPY/URETEROSCOPY/HOLMIUM LASER (Left )  Patient Location: PACU  Anesthesia Type:General  Level of Consciousness: drowsy and patient cooperative  Airway & Oxygen Therapy: Patient Spontanous Breathing and Patient connected to face mask oxygen  Post-op Assessment: Report given to RN and Post -op Vital signs reviewed and stable  Post vital signs: Reviewed and stable  Last Vitals:  Vitals Value Taken Time  BP    Temp    Pulse 102 12/17/2017  4:27 PM  Resp 15 12/17/2017  4:27 PM  SpO2 100 % 12/17/2017  4:27 PM  Vitals shown include unvalidated device data.  Last Pain:  Vitals:   12/17/17 1354  TempSrc:   PainSc: 0-No pain         Complications: No apparent anesthesia complications

## 2017-12-17 NOTE — Discharge Instructions (Signed)
1. You may see some blood in the urine and may have some burning with urination for 48-72 hours. You also may notice that you have to urinate more frequently or urgently after your procedure which is normal.  °2. You should call should you develop an inability urinate, fever > 101, persistent nausea and vomiting that prevents you from eating or drinking to stay hydrated.  °

## 2017-12-17 NOTE — Interval H&P Note (Signed)
History and Physical Interval Note:  12/17/2017 3:08 PM  Terry Hall  has presented today for surgery, with the diagnosis of LEFT URETERAL STONE  The various methods of treatment have been discussed with the patient and family. After consideration of risks, benefits and other options for treatment, the patient has consented to  Procedure(s): CYSTOSCOPY/URETEROSCOPY/HOLMIUM LASER/STENT PLACEMENT (Left) as a surgical intervention .  The patient's history has been reviewed, patient examined, no change in status, stable for surgery.  I have reviewed the patient's chart and labs.  Questions were answered to the patient's satisfaction.     Manasi Dishon,LES

## 2017-12-17 NOTE — Anesthesia Postprocedure Evaluation (Signed)
Anesthesia Post Note  Patient: Terry Hall  Procedure(s) Performed: CYSTOSCOPY/URETEROSCOPY/HOLMIUM LASER (Left )     Patient location during evaluation: PACU Anesthesia Type: General Level of consciousness: awake and alert Pain management: pain level controlled Vital Signs Assessment: post-procedure vital signs reviewed and stable Respiratory status: spontaneous breathing, nonlabored ventilation, respiratory function stable and patient connected to nasal cannula oxygen Cardiovascular status: blood pressure returned to baseline and stable Postop Assessment: no apparent nausea or vomiting Anesthetic complications: no    Last Vitals:  Vitals:   12/17/17 1645 12/17/17 1700  BP: 127/76 136/87  Pulse: (!) 59 (!) 45  Resp: 12 14  Temp:  (!) 36.4 C  SpO2: 100% 100%    Last Pain:  Vitals:   12/17/17 1700  TempSrc:   PainSc: 0-No pain                 Sofiah Lyne S
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
## 2017-12-18 ENCOUNTER — Encounter (HOSPITAL_COMMUNITY): Payer: Self-pay | Admitting: Urology

## 2019-04-11 DIAGNOSIS — U071 COVID-19: Secondary | ICD-10-CM

## 2019-04-11 HISTORY — DX: COVID-19: U07.1

## 2019-06-02 ENCOUNTER — Emergency Department
Admission: EM | Admit: 2019-06-02 | Discharge: 2019-06-02 | Disposition: A | Discharge status: Home / Self Care | Payer: Self-pay | Attending: Emergency Medicine | Admitting: Emergency Medicine

## 2019-06-02 ENCOUNTER — Emergency Department: Payer: Self-pay

## 2019-06-02 DIAGNOSIS — R358 Other polyuria: Secondary | ICD-10-CM | POA: Insufficient documentation

## 2019-06-02 DIAGNOSIS — R42 Dizziness and giddiness: Secondary | ICD-10-CM

## 2019-06-02 DIAGNOSIS — Z8616 Personal history of COVID-19: Secondary | ICD-10-CM | POA: Insufficient documentation

## 2019-06-02 DIAGNOSIS — E119 Type 2 diabetes mellitus without complications: Secondary | ICD-10-CM | POA: Insufficient documentation

## 2019-06-02 LAB — CBC AND DIFFERENTIAL
Absolute NRBC: 0 10*3/uL (ref 0.00–0.00)
Basophils Absolute Automated: 0.04 10*3/uL (ref 0.00–0.08)
Basophils Automated: 0.7 %
Eosinophils Absolute Automated: 0.19 10*3/uL (ref 0.00–0.44)
Eosinophils Automated: 3.5 %
Hematocrit: 47.6 % (ref 37.6–49.6)
Hgb: 16.4 g/dL (ref 12.5–17.1)
Immature Granulocytes Absolute: 0.01 10*3/uL (ref 0.00–0.07)
Immature Granulocytes: 0.2 %
Lymphocytes Absolute Automated: 2.62 10*3/uL (ref 0.42–3.22)
Lymphocytes Automated: 47.8 %
MCH: 28.6 pg (ref 25.1–33.5)
MCHC: 34.5 g/dL (ref 31.5–35.8)
MCV: 82.9 fL (ref 78.0–96.0)
MPV: 9.6 fL (ref 8.9–12.5)
Monocytes Absolute Automated: 0.54 10*3/uL (ref 0.21–0.85)
Monocytes: 9.9 %
Neutrophils Absolute: 2.08 10*3/uL (ref 1.10–6.33)
Neutrophils: 37.9 %
Nucleated RBC: 0 /100 WBC (ref 0.0–0.0)
Platelets: 290 10*3/uL (ref 142–346)
RBC: 5.74 10*6/uL (ref 4.20–5.90)
RDW: 12 % (ref 11–15)
WBC: 5.48 10*3/uL (ref 3.10–9.50)

## 2019-06-02 LAB — URINALYSIS REFLEX TO MICROSCOPIC EXAM - REFLEX TO CULTURE
Bilirubin, UA: NEGATIVE
Blood, UA: NEGATIVE
Glucose, UA: 500 — AB
Leukocyte Esterase, UA: NEGATIVE
Nitrite, UA: NEGATIVE
Protein, UR: 100 — AB
Specific Gravity UA: 1.031 (ref 1.001–1.035)
Urine pH: 5 (ref 5.0–8.0)
Urobilinogen, UA: NEGATIVE mg/dL (ref 0.2–2.0)

## 2019-06-02 LAB — GLUCOSE WHOLE BLOOD - POCT
Whole Blood Glucose POCT: 374 mg/dL — ABNORMAL HIGH (ref 70–100)
Whole Blood Glucose POCT: 393 mg/dL — ABNORMAL HIGH (ref 70–100)

## 2019-06-02 LAB — COMPREHENSIVE METABOLIC PANEL
ALT: 53 U/L (ref 0–55)
AST (SGOT): 27 U/L (ref 5–34)
Albumin/Globulin Ratio: 1.2 (ref 0.9–2.2)
Albumin: 4.3 g/dL (ref 3.5–5.0)
Alkaline Phosphatase: 270 U/L — ABNORMAL HIGH (ref 38–106)
Anion Gap: 14 (ref 5.0–15.0)
BUN: 16 mg/dL (ref 9.0–28.0)
Bilirubin, Total: 0.6 mg/dL (ref 0.2–1.2)
CO2: 27 mEq/L (ref 22–29)
Calcium: 10.1 mg/dL (ref 8.5–10.5)
Chloride: 94 mEq/L — ABNORMAL LOW (ref 100–111)
Creatinine: 1.3 mg/dL (ref 0.7–1.3)
Globulin: 3.6 g/dL (ref 2.0–3.6)
Glucose: 399 mg/dL — ABNORMAL HIGH (ref 70–100)
Potassium: 4.1 mEq/L (ref 3.5–5.1)
Protein, Total: 7.9 g/dL (ref 6.0–8.3)
Sodium: 135 mEq/L — ABNORMAL LOW (ref 136–145)

## 2019-06-02 LAB — TROPONIN I: Troponin I: <0.01 ng/mL (ref 0.00–0.05)

## 2019-06-02 LAB — HEMOLYSIS INDEX: Hemolysis Index: 13 (ref 0–18)

## 2019-06-02 LAB — GFR: EGFR: 56

## 2019-06-02 MED ORDER — METFORMIN HCL 500 MG PO TABS
500.00 mg | ORAL_TABLET | Freq: Two times a day (BID) | ORAL | 0 refills | Status: AC
Start: 2019-06-02 — End: ?

## 2019-06-02 MED ORDER — SODIUM CHLORIDE 0.9 % IV BOLUS
1000.00 mL | Freq: Once | INTRAVENOUS | Status: AC
Start: 2019-06-02 — End: 2019-06-02
  Administered 2019-06-02: 21:00:00 1000 mL via INTRAVENOUS

## 2019-06-02 NOTE — ED Triage Notes (Signed)
EMERGENCY DEPARTMENT PIT NOTE    Patient Name: Tyler West has had a rapid medical screening evaluation initiated by myself for the chief complaint of dizziness and chest pain x 2 weeks. Tested positive for COVID in early January. Dizziness occurs upon standing or turning head quickly. Denies sensation of room spinning around him. States he is constantly dehydrated and has been urinating more frequently.     Vitals: There were no vitals taken for this visit.  Pertinent brief exam: ambulatory, non toxic appearing   Prelim orders: line, labs, ekg, cxr    Patient advised to remain in the ED until further evaluation can be performed. Patient instructed to notify staff of any changes in condition while waiting.    I am not the sole provider and this assessment is only an initial evaluation prior to full evaluation to expedite care.

## 2019-06-02 NOTE — ED Triage Notes (Signed)
Tyler West is a 64 y.o. male c/o dizziness, SOB, & CP when walking since being dx with covid in January 2021.  Also c/o excessive thirst and frequent urination.  Denies hx of DM, but reports high blood sugar in the past. NKA.

## 2019-06-02 NOTE — ED Provider Notes (Signed)
EMERGENCY DEPARTMENT HISTORY AND PHYSICAL EXAM    PPE: Mask, face shield and gloves were worn every time I entered the room.     History of Presenting Illness:  History Provided By: Patient    Mikhai Clayborn is a 64 y.o. male pw polydipsia and polyuria x 20 days.  Recently recovered from COVID early January.  Stayed home and took OTC meds.  Didn't take any steroids.  No abd pain, chest pain or SOB.     +Fam Hx of DM.     Reviewed and confirmed past medical, surgical, family and social history as documented.    PCP: Pcp, None, MD  SPECIALISTS:    Review of Systems:  Review of Systems   Constitutional: Negative for chills and fever.   Respiratory: Negative for cough and shortness of breath.    Cardiovascular: Negative for chest pain and leg swelling.   Endocrine: Positive for polydipsia and polyphagia.   Skin:        Dry skin   All other systems reviewed as negative.    Physical Exam:  Vitals:    06/02/19 2023 06/02/19 2051 06/02/19 2200   BP: 134/84 119/79 112/73   Pulse: (!) 104 87 72   Resp: 18     Temp: 98.5 F (36.9 C)     TempSrc: Oral     SpO2: 98%  99%   Weight: 55 kg     Height: 5\' 5"  (1.651 m)       Pulse Oximetry Interpretation: Normal     Physical Exam   Constitutional: Patient is alert.  Well nourished.  NAD  Head: Atraumatic.   Eyes: EOMI. PERRL  ENT:  MMM.   Neck:  FROM. No spinal tenderness. Neck supple.    Cardiovascular: Normal rate and regular rhythm.   Pulmonary/Chest: Effort normal and breath sounds normal. No respiratory distress.   Abdominal: Soft. There is no tenderness. Bowel sounds present and normal.    Musculoskeletal:  No lower extremity edema or tenderness.    Neurological: Patient is alert and oriented to person, place, and time.  No focal deficits.   Skin: Skin is warm and dry.    EKG:  Interpreted by the EP.   Time Interpreted: 2032   Rate: 86   Rhythm: Normal Sinus Rhythm    Interpretation: No ST/TW changes.    Comparison: No sig change 2014    Old Medical Records: Old medical  records.  Previous electrocardiograms.  Nursing notes.    Patient Update Notes:       Provider Notes: New onset DM.  Start metformin.  F/U Transitional Care.     Clinical Impression:   1. New onset type 2 diabetes mellitus        ED Disposition     ED Disposition Condition Date/Time Comment    Discharge  Mon Jun 02, 2019 10:32 PM Aum Nickolaos discharge to home/self care.    Condition at disposition: Stable              This note was generated by the Epic EMR system/ Dragon speech recognition and may contain inherent errors or omissions not intended by the user. Grammatical errors, random word insertions, deletions and pronoun errors  are occasional consequences of this technology due to software limitations. Not all errors are caught or corrected. If there are questions or concerns about the content of this note or information contained within the body of this dictation they should be addressed directly with the author for  clarification     Tenny Craw, MD  06/02/19 [phone]

## 2019-06-02 NOTE — Discharge Instructions (Signed)
Diabetes Mellitus, Type II     You have been diagnosed with type II diabetes.     Diabetes is a disease. It is when the body has trouble regulating the amount of sugar in the blood stream.     Type II diabetes is sometimes called "adult-onset diabetes". It is usually found in adults older than 40. However, type II diabetes is also seen in children and young adults.     Diabetes is a very serious disease. When not controlled, it can cause life-threatening problems. Untreated diabetes can lead to heart problems and kidney problems (including kidney failure). It can also lead to stroke and blindness.     Insulin and other medicines for diabetes can make you feel lightheaded. You may sweat or even pass out. Always keep a snack food that contains sugar with you. If you begin to have these symptoms, eat the snack food to see if the symptoms get better. NEVER drive a car when feeling this way.     YOU SHOULD SEEK MEDICAL ATTENTION IMMEDIATELY, EITHER HERE OR AT THE NEAREST EMERGENCY DEPARTMENT, IF ANY OF THE FOLLOWING OCCURS:  · Abdominal (belly) pain.  · More than 3 vomiting (throwing up) episodes.  · Blood sugar over 400 more than 3 times.  · Fever (temperature higher than 100.4ºF / 38ºC) or shaking chills.  · Unable to keep medicines down.  · Any trouble swallowing or breathing.  · Infection or rash on your skin.  · Low blood sugar (less than 70) 3 or more times that does not get better after eating.

## 2019-06-03 ENCOUNTER — Telehealth (INDEPENDENT_AMBULATORY_CARE_PROVIDER_SITE_OTHER): Payer: Self-pay

## 2019-06-03 NOTE — Telephone Encounter (Signed)
[facility] ([facility])    Received a referral to schedule a follow up appointment with the [facility].  Left patient a voicemail and provided main clinic phone number.  Requested patient return call to schedule a follow up appointment as soon as possible.       Ingrid Benitez  [facility] Services   Sched Reg Rep II  T [phone]  F [phone]

## 2019-06-05 LAB — ECG 12-LEAD
Atrial Rate: 86 {beats}/min
P Axis: 70 degrees
P-R Interval: 150 ms
Q-T Interval: 336 ms
QRS Duration: 72 ms
QTC Calculation (Bezet): 402 ms
R Axis: 64 degrees
T Axis: 72 degrees
Ventricular Rate: 86 {beats}/min

## 2019-06-25 ENCOUNTER — Other Ambulatory Visit (INDEPENDENT_AMBULATORY_CARE_PROVIDER_SITE_OTHER): Payer: Self-pay

## 2019-09-14 IMAGING — CT CT RENAL STONE PROTOCOL
2 of 4 series · 16 of 46 positions shown, 18 images · non-contrast
Comparison: CT scan of November 19, 2014.

CLINICAL DATA: Acute lower back pain.

EXAM:
CT ABDOMEN AND PELVIS WITHOUT CONTRAST
TECHNIQUE: Multidetector CT imaging of the abdomen and pelvis was performed
following the standard protocol without IV contrast.

[Series 2: axial st · axial · 0.72mm/px · z∈[+1098,+1483]mm · 13 of 87 slices shown, 15 images]
[im 5/87  soft-tissue]
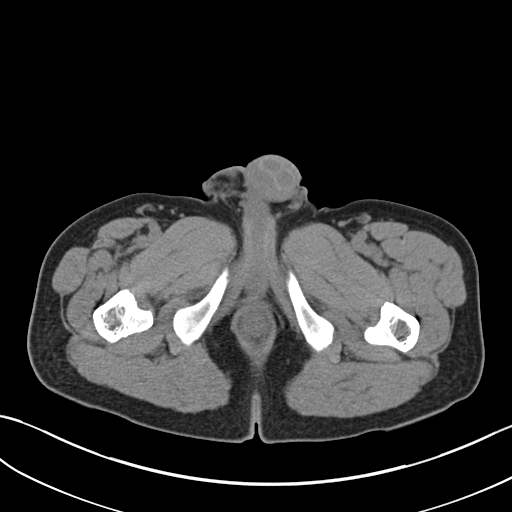
[im 5/87  bone]
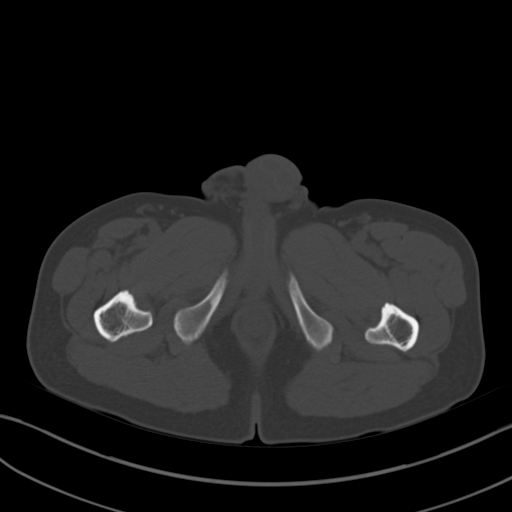
[im 13/87  soft-tissue]
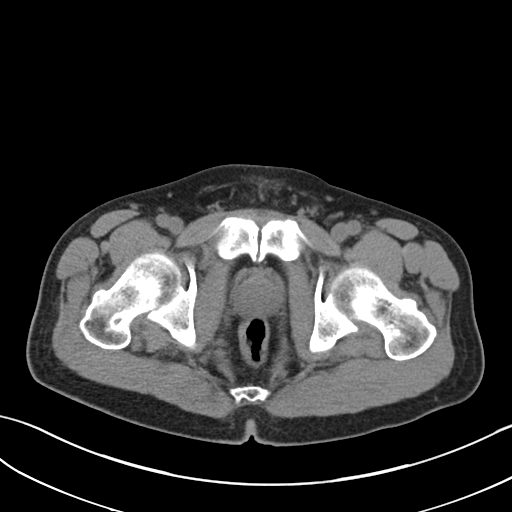
[im 18/87  soft-tissue]
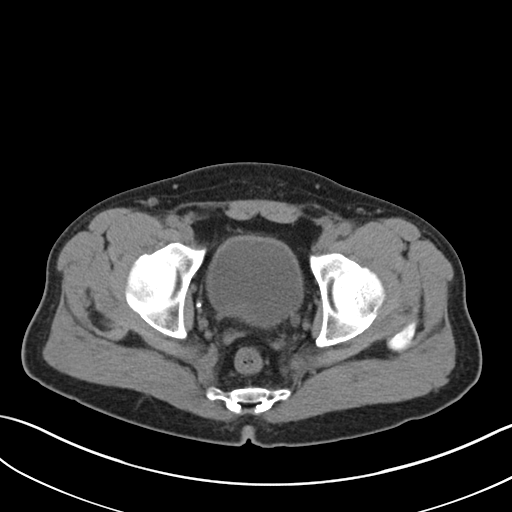
[im 26/87  soft-tissue]
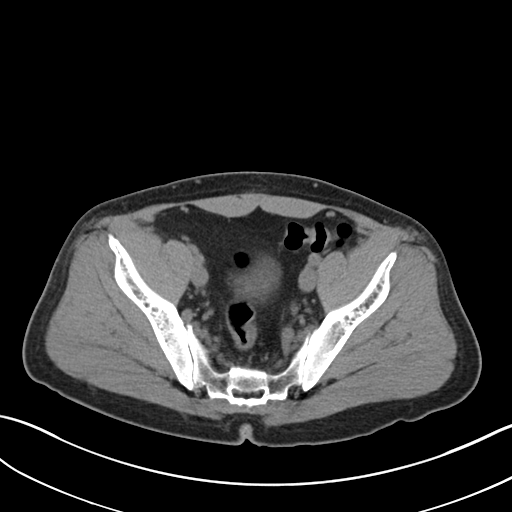
[im 31/87  soft-tissue]
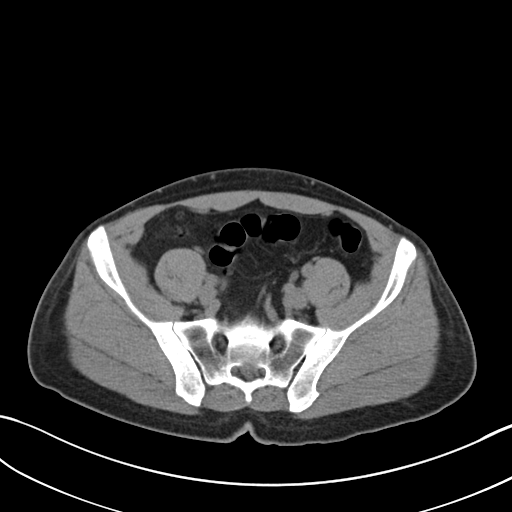
[im 39/87  soft-tissue]
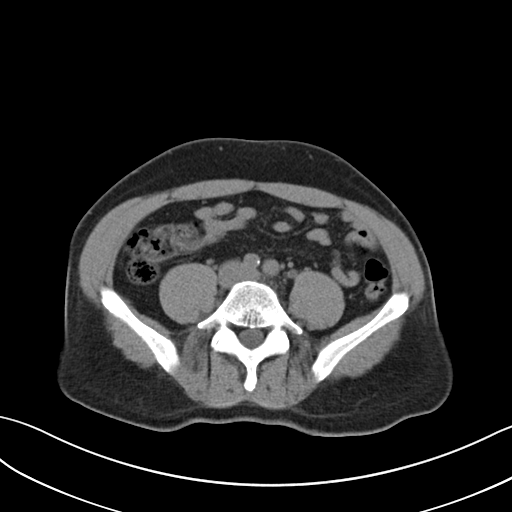
[im 44/87  soft-tissue]
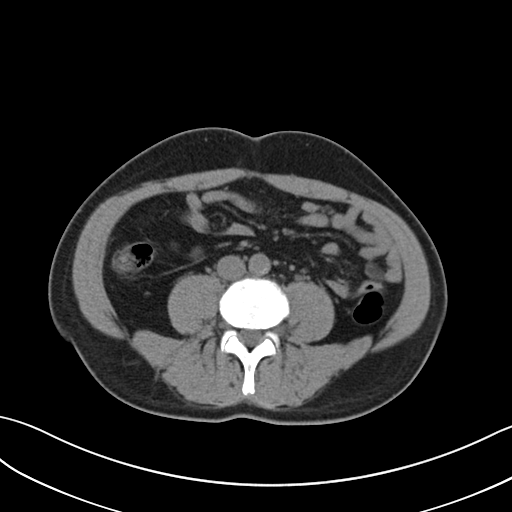
[im 48/87  soft-tissue]
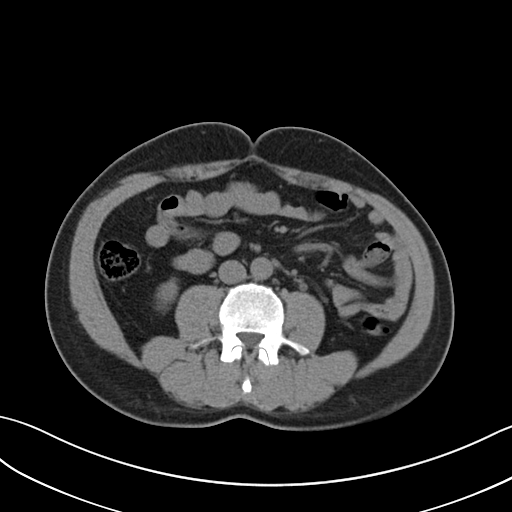
[im 56/87  soft-tissue]
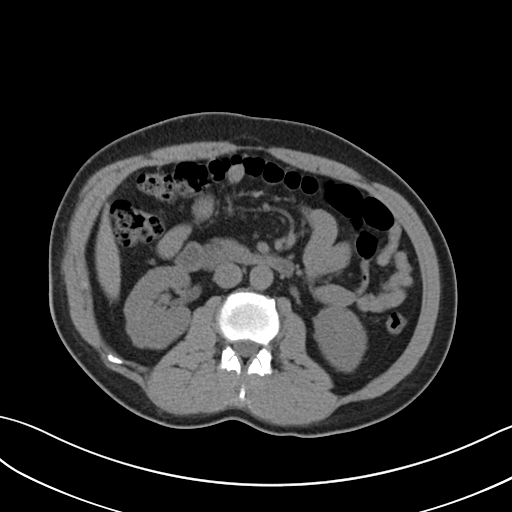
[im 56/87  bone]
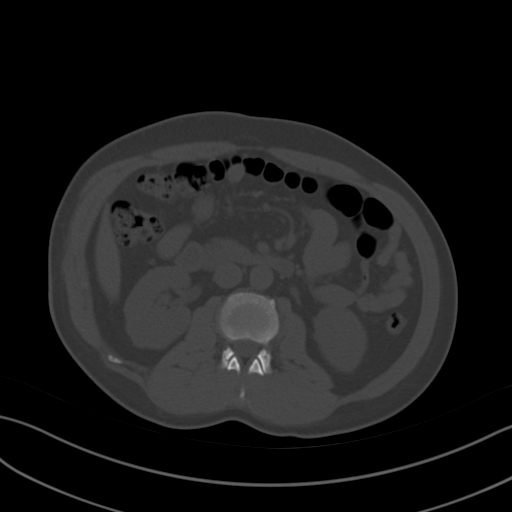
[im 61/87  soft-tissue]
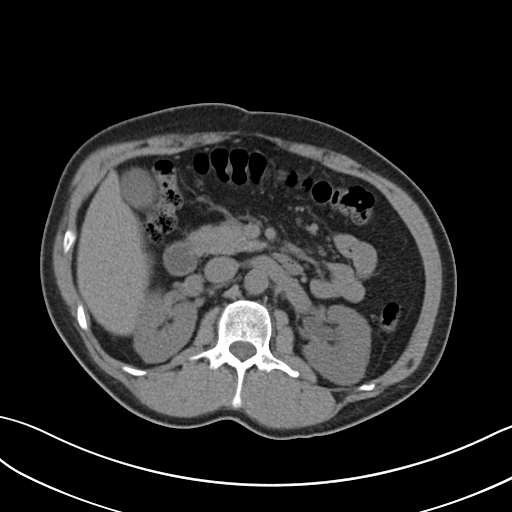
[im 69/87  soft-tissue]
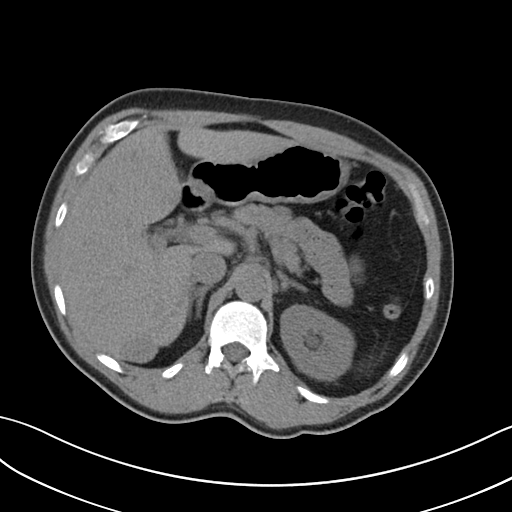
[im 74/87  soft-tissue]
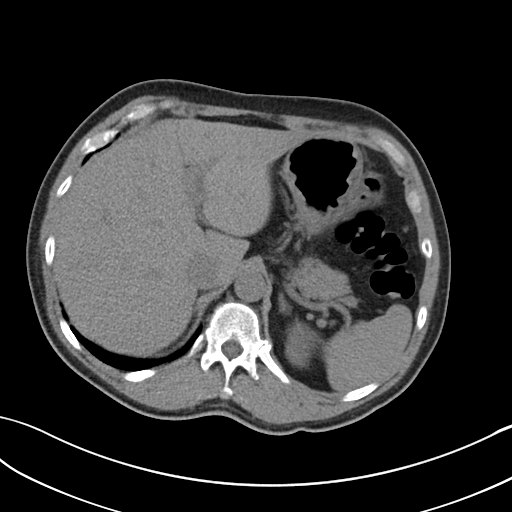
[im 82/87  soft-tissue]
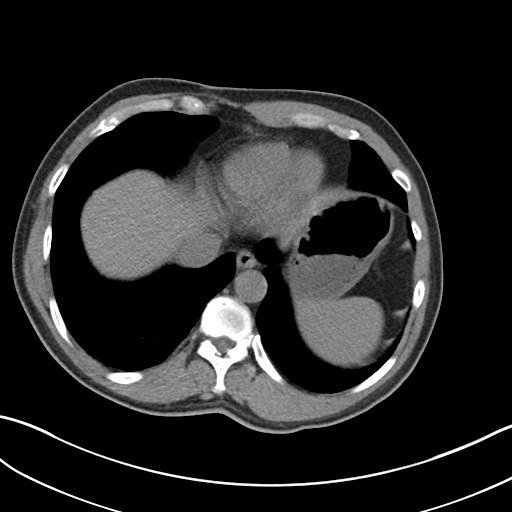

[Series 5: coronal · coronal · 0.63mm/px · 3 of 133 slices shown]
[im 45/133  soft-tissue]
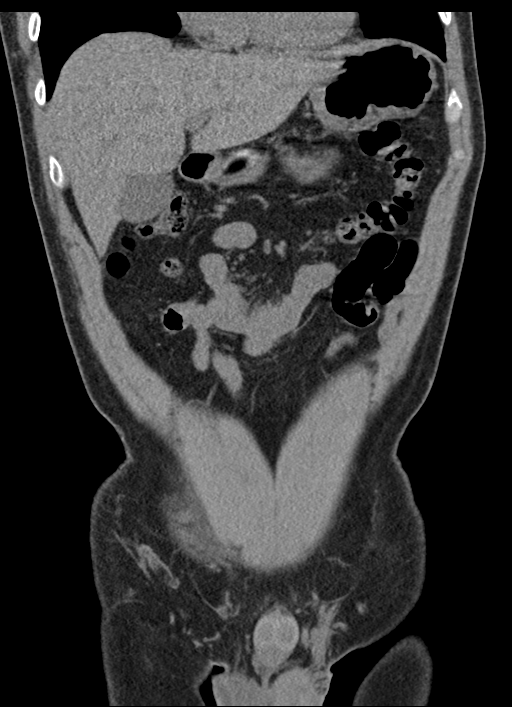
[im 59/133  soft-tissue]
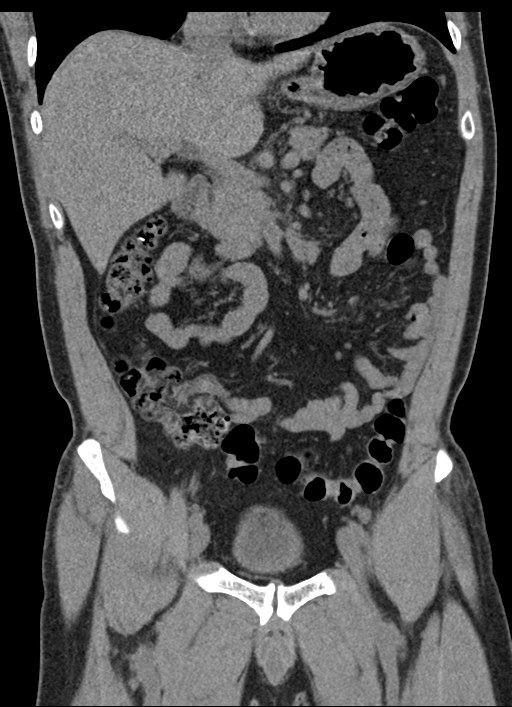
[im 74/133  soft-tissue]
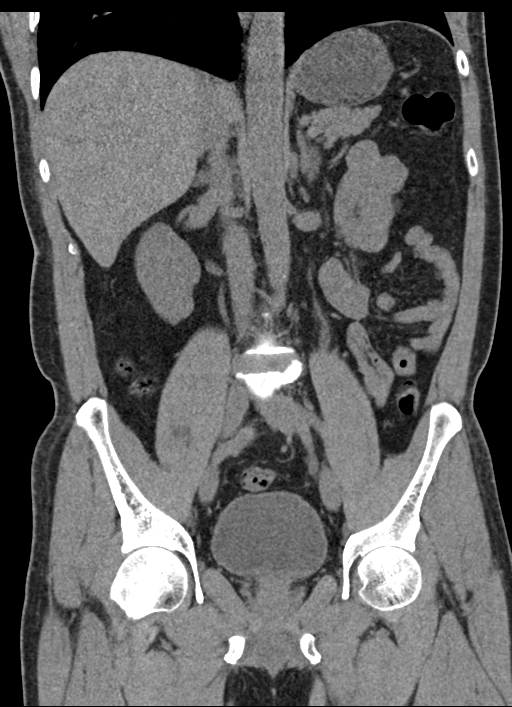

[16 of 46 positions shown; findings below may reference images not displayed]

FINDINGS: Lower chest: No acute abnormality.

Hepatobiliary: No focal liver abnormality is seen. No gallstones,
gallbladder wall thickening, or biliary dilatation.

Pancreas: Unremarkable. No pancreatic ductal dilatation or
surrounding inflammatory changes.

Spleen: Normal in size without focal abnormality.

Adrenals/Urinary Tract: Adrenal glands appear normal. Bilateral
nephrolithiasis is noted. Mild left hydroureteronephrosis is noted
secondary to 6 mm calculus at the left ureterovesical junction.
Urinary bladder is unremarkable.

Stomach/Bowel: Stomach is within normal limits. Appendix appears
normal. No evidence of bowel wall thickening, distention, or
inflammatory changes.

Vascular/Lymphatic: No significant vascular findings are present. No
enlarged abdominal or pelvic lymph nodes.

Reproductive: Prostate is unremarkable.

Other: No abdominal wall hernia or abnormality. No abdominopelvic
ascites.

Musculoskeletal: No acute or significant osseous findings.
IMPRESSION: Bilateral nephrolithiasis. Mild left hydroureteronephrosis is noted
secondary to 6 mm calculus at the left ureterovesical junction.

## 2019-11-06 ENCOUNTER — Ambulatory Visit: Payer: BLUE CROSS/BLUE SHIELD

## 2020-04-19 ENCOUNTER — Ambulatory Visit: Payer: Self-pay

## 2020-04-20 ENCOUNTER — Ambulatory Visit: Payer: Self-pay

## 2020-12-13 ENCOUNTER — Other Ambulatory Visit: Payer: Self-pay

## 2020-12-13 ENCOUNTER — Emergency Department (HOSPITAL_BASED_OUTPATIENT_CLINIC_OR_DEPARTMENT_OTHER)
Admission: EM | Admit: 2020-12-13 | Discharge: 2020-12-13 | Disposition: A | Discharge status: Home / Self Care | Payer: BLUE CROSS/BLUE SHIELD | Attending: Emergency Medicine | Admitting: Emergency Medicine

## 2020-12-13 ENCOUNTER — Encounter (HOSPITAL_BASED_OUTPATIENT_CLINIC_OR_DEPARTMENT_OTHER): Payer: Self-pay | Admitting: Emergency Medicine

## 2020-12-13 ENCOUNTER — Emergency Department (HOSPITAL_BASED_OUTPATIENT_CLINIC_OR_DEPARTMENT_OTHER): Payer: BLUE CROSS/BLUE SHIELD

## 2020-12-13 DIAGNOSIS — R109 Unspecified abdominal pain: Secondary | ICD-10-CM | POA: Diagnosis present

## 2020-12-13 DIAGNOSIS — E119 Type 2 diabetes mellitus without complications: Secondary | ICD-10-CM | POA: Insufficient documentation

## 2020-12-13 HISTORY — DX: Calculus of ureter: N20.1

## 2020-12-13 HISTORY — DX: Disorder of kidney and ureter, unspecified: N28.9

## 2020-12-13 LAB — CBC WITH DIFFERENTIAL/PLATELET
Abs Immature Granulocytes: 0.01 10*3/uL (ref 0.00–0.07)
Basophils Absolute: 0.1 10*3/uL (ref 0.0–0.1)
Basophils Relative: 1 %
Eosinophils Absolute: 0.6 10*3/uL — ABNORMAL HIGH (ref 0.0–0.5)
Eosinophils Relative: 10 %
HCT: 45.2 % (ref 39.0–52.0)
Hemoglobin: 15.5 g/dL (ref 13.0–17.0)
Immature Granulocytes: 0 %
Lymphocytes Relative: 55 %
Lymphs Abs: 3.1 10*3/uL (ref 0.7–4.0)
MCH: 29.5 pg (ref 26.0–34.0)
MCHC: 34.3 g/dL (ref 30.0–36.0)
MCV: 85.9 fL (ref 80.0–100.0)
Monocytes Absolute: 0.6 10*3/uL (ref 0.1–1.0)
Monocytes Relative: 10 %
Neutro Abs: 1.3 10*3/uL — ABNORMAL LOW (ref 1.7–7.7)
Neutrophils Relative %: 24 %
Platelets: 315 10*3/uL (ref 150–400)
RBC: 5.26 MIL/uL (ref 4.22–5.81)
RDW: 12.7 % (ref 11.5–15.5)
WBC: 5.6 10*3/uL (ref 4.0–10.5)
nRBC: 0 % (ref 0.0–0.2)

## 2020-12-13 LAB — COMPREHENSIVE METABOLIC PANEL
ALT: 38 U/L (ref 0–44)
AST: 30 U/L (ref 15–41)
Albumin: 4.2 g/dL (ref 3.5–5.0)
Alkaline Phosphatase: 151 U/L — ABNORMAL HIGH (ref 38–126)
Anion gap: 8 (ref 5–15)
BUN: 13 mg/dL (ref 8–23)
CO2: 28 mmol/L (ref 22–32)
Calcium: 9.5 mg/dL (ref 8.9–10.3)
Chloride: 101 mmol/L (ref 98–111)
Creatinine, Ser: 1.05 mg/dL (ref 0.61–1.24)
GFR, Estimated: >60 mL/min (ref >60)
Glucose, Bld: 201 mg/dL — ABNORMAL HIGH (ref 70–99)
Potassium: 4.1 mmol/L (ref 3.5–5.1)
Sodium: 137 mmol/L (ref 135–145)
Total Bilirubin: 0.4 mg/dL (ref 0.3–1.2)
Total Protein: 7.5 g/dL (ref 6.5–8.1)

## 2020-12-13 LAB — URINALYSIS, ROUTINE W REFLEX MICROSCOPIC
Bilirubin Urine: NEGATIVE
Glucose, UA: NEGATIVE mg/dL
Hgb urine dipstick: NEGATIVE
Ketones, ur: NEGATIVE mg/dL
Leukocytes,Ua: NEGATIVE
Nitrite: NEGATIVE
Protein, ur: NEGATIVE mg/dL
Specific Gravity, Urine: 1.015 (ref 1.005–1.030)
pH: 6 (ref 5.0–8.0)

## 2020-12-13 MED ORDER — KETOROLAC TROMETHAMINE 15 MG/ML IJ SOLN
15.0000 mg | Freq: Once | INTRAMUSCULAR | Status: AC
  Administered 2020-12-13: 15 mg via INTRAVENOUS
  Filled 2020-12-13: qty 1

## 2020-12-13 MED ORDER — FENTANYL CITRATE PF 50 MCG/ML IJ SOSY
100.0000 ug | PREFILLED_SYRINGE | Freq: Once | INTRAMUSCULAR | Status: AC
  Administered 2020-12-13: 100 ug via INTRAVENOUS
  Filled 2020-12-13: qty 2

## 2020-12-13 MED ORDER — ONDANSETRON HCL 4 MG/2ML IJ SOLN
4.0000 mg | Freq: Once | INTRAMUSCULAR | Status: AC
  Administered 2020-12-13: 4 mg via INTRAVENOUS
  Filled 2020-12-13: qty 2

## 2020-12-13 NOTE — ED Provider Notes (Signed)
[facility] Provider Note: Lowella Dell, MD, FACEP  CSN: [id number] MRN: [id number] ARRIVAL: 12/13/20 at 0235 ROOM: [facility]10/[facility]10   CHIEF COMPLAINT  Flank Pain   HISTORY OF PRESENT ILLNESS  12/13/20 2:52 AM Terry Hall is a 65 y.o. male with a history of ureterolithiasis.  He is here with right flank pain that started about 1 hour ago.  He states the pain was severe at its worst and now rates it about a 5 out of 10.  It is somewhat worse with palpation of the right flank.  It does not radiate.  He describes it as like previous kidney stones.  He has not noticed hematuria.  He has had nausea and vomiting with this.   Past Medical History:  Diagnosis Date   Diabetes mellitus without complication (HCC)    pt states he has never been told he was a diabetic   Renal disorder    nephrolithiasis   Ureterolithiasis     Past Surgical History:  Procedure Laterality Date   CYSTOSCOPY WITH STENT PLACEMENT Left 11/28/2017   Procedure: CYSTOSCOPY WITH STENT PLACEMENT;  Surgeon: Heloise Purpura, MD;  Location: [facility];  Service: Urology;  Laterality: Left;   CYSTOSCOPY/URETEROSCOPY/HOLMIUM LASER/STENT PLACEMENT Left 12/17/2017   Procedure: CYSTOSCOPY/URETEROSCOPY/HOLMIUM LASER;  Surgeon: Heloise Purpura, MD;  Location: [facility];  Service: Urology;  Laterality: Left;    No family history on file.  Social History   Tobacco Use   Smoking status: Never   Smokeless tobacco: Never  Vaping Use   Vaping Use: Never used  Substance Use Topics   Alcohol use: No   Drug use: No    Prior to Admission medications   Not on File    Allergies Patient has no known allergies.   REVIEW OF SYSTEMS  Negative except as noted here or in the History of Present Illness.   PHYSICAL EXAMINATION  Initial Vital Signs Temperature 98.5 F (36.9 C), resp. rate 20, height 5\' 6"  (1.676 m), weight 59 kg.  Examination General: Well-developed, well-nourished male in no acute distress; appearance  consistent with age of record HENT: normocephalic; atraumatic Eyes: Normal appearance Neck: supple Heart: regular rate and rhythm Lungs: clear to auscultation bilaterally Abdomen: soft; nondistended; nontender; bowel sounds present GU: Right CVA tenderness Extremities: No deformity; full range of motion Neurologic: Awake, alert and oriented; motor function intact in all extremities and symmetric; no facial droop Skin: Warm and dry Psychiatric: Normal mood and affect   RESULTS  Summary of this visit's results, reviewed and interpreted by myself:   EKG Interpretation  Date/Time:    Ventricular Rate:    PR Interval:    QRS Duration:   QT Interval:    QTC Calculation:   R Axis:     Text Interpretation:         Laboratory Studies: Results for orders placed or performed during the hospital encounter of 12/13/20 (from the past 24 hour(s))  CBC with Differential     Status: Abnormal   Collection Time: 12/13/20  2:50 AM  Result Value Ref Range   WBC 5.6 4.0 - 10.5 K/uL   RBC 5.26 4.22 - 5.81 MIL/uL   Hemoglobin 15.5 13.0 - 17.0 g/dL   HCT 02/12/21 25.8 - 52.7 %   MCV 85.9 80.0 - 100.0 fL   MCH 29.5 26.0 - 34.0 pg   MCHC 34.3 30.0 - 36.0 g/dL   RDW 78.2 42.3 - 53.6 %   Platelets 315 150 - 400 K/uL  nRBC 0.0 0.0 - 0.2 %   Neutrophils Relative % 24 %   Neutro Abs 1.3 (L) 1.7 - 7.7 K/uL   Lymphocytes Relative 55 %   Lymphs Abs 3.1 0.7 - 4.0 K/uL   Monocytes Relative 10 %   Monocytes Absolute 0.6 0.1 - 1.0 K/uL   Eosinophils Relative 10 %   Eosinophils Absolute 0.6 (H) 0.0 - 0.5 K/uL   Basophils Relative 1 %   Basophils Absolute 0.1 0.0 - 0.1 K/uL   Immature Granulocytes 0 %   Abs Immature Granulocytes 0.01 0.00 - 0.07 K/uL  Comprehensive metabolic panel     Status: Abnormal   Collection Time: 12/13/20  2:50 AM  Result Value Ref Range   Sodium 137 135 - 145 mmol/L   Potassium 4.1 3.5 - 5.1 mmol/L   Chloride 101 98 - 111 mmol/L   CO2 28 22 - 32 mmol/L   Glucose, Bld 201  (H) 70 - 99 mg/dL   BUN 13 8 - 23 mg/dL   Creatinine, Ser 1.50 0.61 - 1.24 mg/dL   Calcium 9.5 8.9 - 56.9 mg/dL   Total Protein 7.5 6.5 - 8.1 g/dL   Albumin 4.2 3.5 - 5.0 g/dL   AST 30 15 - 41 U/L   ALT 38 0 - 44 U/L   Alkaline Phosphatase 151 (H) 38 - 126 U/L   Total Bilirubin 0.4 0.3 - 1.2 mg/dL   GFR, Estimated >79 >48 mL/min   Anion gap 8 5 - 15  Urinalysis, Routine w reflex microscopic Urine, Clean Catch     Status: None   Collection Time: 12/13/20  2:50 AM  Result Value Ref Range   Color, Urine YELLOW YELLOW   APPearance CLEAR CLEAR   Specific Gravity, Urine 1.015 1.005 - 1.030   pH 6.0 5.0 - 8.0   Glucose, UA NEGATIVE NEGATIVE mg/dL   Hgb urine dipstick NEGATIVE NEGATIVE   Bilirubin Urine NEGATIVE NEGATIVE   Ketones, ur NEGATIVE NEGATIVE mg/dL   Protein, ur NEGATIVE NEGATIVE mg/dL   Nitrite NEGATIVE NEGATIVE   Leukocytes,Ua NEGATIVE NEGATIVE   Imaging Studies: CT Renal Stone Study  Result Date: 12/13/2020 CLINICAL DATA:  Right flank pain, nephrolithiasis EXAM: CT ABDOMEN AND PELVIS WITHOUT CONTRAST TECHNIQUE: Multidetector CT imaging of the abdomen and pelvis was performed following the standard protocol without IV contrast. COMPARISON:  11/28/2017, 11/01/2005 FINDINGS: Lower chest: The visualized lung bases are clear. The visualized heart and pericardium are unremarkable. Hepatobiliary: There is a 2.8 cm stable subcapsular ovoid hypodense lesion involving the posterior right hepatic lobe at axial image # 23/2 which is stable since remote prior examination. At that time, this demonstrated brisk enhancement with evidence of early venous drainage and a transient hyper perfusion abnormality of this segment of the liver suggesting this may represent a vascular malformation or flash filled hemangioma. This, however, is stable in keeping with a benign lesion. The liver is otherwise unremarkable. Gallbladder unremarkable. No intra or extrahepatic biliary ductal dilation. Pancreas:  Unremarkable Spleen: Unremarkable Adrenals/Urinary Tract: The adrenal glands are unremarkable. The kidneys are normal in size and position. There is bilateral nonobstructing nephrolithiasis with numerous nonobstructing calculi measuring up to 2-3 mm. No ureteral calculi. No hydronephrosis. No perinephric inflammatory stranding or fluid collections are identified. A punctate 1 mm calculus is seen dependently within the bladder lumen, possibly representing a recently passed renal calculus. The bladder is otherwise unremarkable. Stomach/Bowel: The stomach, small bowel, and large bowel are unremarkable. The appendix is not clearly identified and may  be absent. No free intraperitoneal gas or fluid. Vascular/Lymphatic: Mild aortoiliac atherosclerotic calcification. No aortic aneurysm. No pathologic adenopathy within the abdomen and pelvis. Reproductive: Prostate is unremarkable. Other: Tiny fat containing right inguinal hernia. The rectum is unremarkable. Musculoskeletal: No acute bone abnormality. Degenerative changes are seen within the lumbar spine. No lytic or blastic bone lesions are seen. IMPRESSION: Mild bilateral nonobstructing nephrolithiasis. No urolithiasis. No hydronephrosis. Stable ovoid subcapsular mass within the posterior right hepatic lobe since remote prior examination of 11/01/2005. Enhancement pattern at that time favors a vascular lesion such as a vascular malformation or flash fill hemangioma. This is not optimally characterized on this examination, however, and liver protocol CT or MRI examination could be utilized for further evaluation. The stability over time, however, is in keeping with a benign etiology. Aortic Atherosclerosis (ICD10-I70.0). Electronically Signed   By: Helyn Numbers M.D.   On: 12/13/2020 03:32    ED COURSE and MDM  Nursing notes, initial and subsequent vitals signs, including pulse oximetry, reviewed and interpreted by myself.  Vitals:   12/13/20 0247 12/13/20 0315  12/13/20 0345  BP:  135/77 108/71  Pulse:  66 (!) 58  Resp: 20  18  Temp: 98.5 F (36.9 C)    SpO2:  93% 97%  Weight: 59 kg    Height: 5\' 6"  (1.676 m)     Medications  ketorolac (TORADOL) 15 MG/ML injection 15 mg (has no administration in time range)  ondansetron (ZOFRAN) injection 4 mg (4 mg Intravenous Given 12/13/20 0257)  fentaNYL (SUBLIMAZE) injection 100 mcg (100 mcg Intravenous Given 12/13/20 0257)   4:08 AM Patient has minimal pain at this time.  I suspect he passed a kidney stone as no ureterolithiasis was seen on CT scan.   PROCEDURES  Procedures   ED DIAGNOSES     ICD-10-CM   1. Right flank pain  R10.9          Kholton Coate, 02/12/21, MD 12/13/20 [phone]

## 2020-12-13 NOTE — ED Triage Notes (Signed)
Reports right flank pain that started 1 hr pta. Hx kdiney stones

## 2021-01-13 ENCOUNTER — Encounter (HOSPITAL_COMMUNITY): Payer: Self-pay | Admitting: Radiology

## 2021-12-19 ENCOUNTER — Ambulatory Visit (HOSPITAL_COMMUNITY)
Admission: RE | Admit: 2021-12-19 | Discharge: 2021-12-19 | Disposition: A | Discharge status: Home / Self Care | Payer: Commercial Managed Care - HMO | Source: Ambulatory Visit | Attending: Family Medicine | Admitting: Family Medicine

## 2021-12-19 ENCOUNTER — Encounter (HOSPITAL_COMMUNITY): Payer: Self-pay

## 2021-12-19 VITALS — BP 145/88 | HR 52 | Temp 98.4°F | Resp 15

## 2021-12-19 DIAGNOSIS — Z7984 Long term (current) use of oral hypoglycemic drugs: Secondary | ICD-10-CM

## 2021-12-19 DIAGNOSIS — E119 Type 2 diabetes mellitus without complications: Secondary | ICD-10-CM | POA: Diagnosis present

## 2021-12-19 DIAGNOSIS — E1165 Type 2 diabetes mellitus with hyperglycemia: Secondary | ICD-10-CM | POA: Diagnosis not present

## 2021-12-19 LAB — CBC
HCT: 43.1 % (ref 39.0–52.0)
Hemoglobin: 14.6 g/dL (ref 13.0–17.0)
MCH: 28.9 pg (ref 26.0–34.0)
MCHC: 33.9 g/dL (ref 30.0–36.0)
MCV: 85.2 fL (ref 80.0–100.0)
Platelets: 299 10*3/uL (ref 150–400)
RBC: 5.06 MIL/uL (ref 4.22–5.81)
RDW: 13.1 % (ref 11.5–15.5)
WBC: 4.5 10*3/uL (ref 4.0–10.5)
nRBC: 0 % (ref 0.0–0.2)

## 2021-12-19 LAB — COMPREHENSIVE METABOLIC PANEL
ALT: 45 U/L — ABNORMAL HIGH (ref 0–44)
AST: 29 U/L (ref 15–41)
Albumin: 3.5 g/dL (ref 3.5–5.0)
Alkaline Phosphatase: 140 U/L — ABNORMAL HIGH (ref 38–126)
Anion gap: 7 (ref 5–15)
BUN: 17 mg/dL (ref 8–23)
CO2: 23 mmol/L (ref 22–32)
Calcium: 9.1 mg/dL (ref 8.9–10.3)
Chloride: 107 mmol/L (ref 98–111)
Creatinine, Ser: 0.94 mg/dL (ref 0.61–1.24)
GFR, Estimated: >60 mL/min (ref >60)
Glucose, Bld: 138 mg/dL — ABNORMAL HIGH (ref 70–99)
Potassium: 4.2 mmol/L (ref 3.5–5.1)
Sodium: 137 mmol/L (ref 135–145)
Total Bilirubin: 1 mg/dL (ref 0.3–1.2)
Total Protein: 6.3 g/dL — ABNORMAL LOW (ref 6.5–8.1)

## 2021-12-19 LAB — HEMOGLOBIN A1C
Hgb A1c MFr Bld: 7.4 % — ABNORMAL HIGH (ref 4.8–5.6)
Mean Plasma Glucose: 165.68 mg/dL

## 2021-12-19 NOTE — Discharge Instructions (Signed)
You have had labs (blood work) sent today. We will call you with any significant abnormalities or if there is need to begin or change treatment or pursue further follow up. Continue your current medications.  You may also review your test results online through MyChart. If you do not have a MyChart account, instructions to sign up should be on your discharge paperwork.

## 2021-12-19 NOTE — ED Triage Notes (Signed)
Pt reports that has diabetes and takes Glimepiride 3mg  and wants check up as dont have PCP.  Reports only eats once a day. Exercises and plays soccer.  Pt reports at night has leg pains and pains in bilat hands for 3 years.

## 2021-12-22 NOTE — ED Provider Notes (Signed)
[facility]   [id number] 12/19/21 Arrival Time: 1101  ASSESSMENT & PLAN:  1. Type 2 diabetes mellitus without complication, without long-term current use of insulin (HCC)    Labs Reviewed  COMPREHENSIVE METABOLIC PANEL - Abnormal; Notable for the following components:      Result Value   Glucose, Bld 138 (*)    Total Protein 6.3 (*)    ALT 45 (*)    Alkaline Phosphatase 140 (*)    All other components within normal limits  HEMOGLOBIN A1C - Abnormal; Notable for the following components:   Hgb A1c MFr Bld 7.4 (*)    All other components within normal limits  CBC   Agrees to schedule f/u with a PCP. Informed of labs via RN. Continue current medications.  Reviewed expectations re: course of current medical issues. Questions answered. Outlined signs and symptoms indicating need for more acute intervention. Understanding verbalized. After Visit Summary given.   SUBJECTIVE: History from: Patient. Terry Hall is a 66 y.o. male. Pt reports that has diabetes and takes Glimepiride 3mg  and wants "check up". Reports only eats once a day. Exercises and plays soccer.  Pt reports occasional leg pains and pains in bilat hands; x 3 years. Denies: fever and difficulty breathing. Normal PO intake without n/v/d.  OBJECTIVE:  Vitals:   12/19/21 1123  BP: (!) 145/88  Pulse: (!) 52  Resp: 15  Temp: 98.4 F (36.9 C)  TempSrc: Oral  SpO2: 97%    General appearance: alert; no distress; appears healthy Eyes: PERRLA; EOMI; conjunctiva normal HENT: [place]; AT Neck: supple  CV: RRR Lungs: speaks full sentences without difficulty; unlabored Extremities: no edema Skin: warm and dry Neurologic: normal gait Psychological: alert and cooperative; normal mood and affect  Labs: Results for orders placed or performed during the hospital encounter of 12/19/21  CBC  Result Value Ref Range   WBC 4.5 4.0 - 10.5 K/uL   RBC 5.06 4.22 - 5.81 MIL/uL   Hemoglobin 14.6 13.0 - 17.0 g/dL    HCT 02/18/22 62.8 - 31.5 %   MCV 85.2 80.0 - 100.0 fL   MCH 28.9 26.0 - 34.0 pg   MCHC 33.9 30.0 - 36.0 g/dL   RDW 17.6 16.0 - 73.7 %   Platelets 299 150 - 400 K/uL   nRBC 0.0 0.0 - 0.2 %  Comprehensive metabolic panel  Result Value Ref Range   Sodium 137 135 - 145 mmol/L   Potassium 4.2 3.5 - 5.1 mmol/L   Chloride 107 98 - 111 mmol/L   CO2 23 22 - 32 mmol/L   Glucose, Bld 138 (H) 70 - 99 mg/dL   BUN 17 8 - 23 mg/dL   Creatinine, Ser 10.6 0.61 - 1.24 mg/dL   Calcium 9.1 8.9 - 2.69 mg/dL   Total Protein 6.3 (L) 6.5 - 8.1 g/dL   Albumin 3.5 3.5 - 5.0 g/dL   AST 29 15 - 41 U/L   ALT 45 (H) 0 - 44 U/L   Alkaline Phosphatase 140 (H) 38 - 126 U/L   Total Bilirubin 1.0 0.3 - 1.2 mg/dL   GFR, Estimated 48.5 >46 mL/min   Anion gap 7 5 - 15  Hemoglobin A1c  Result Value Ref Range   Hgb A1c MFr Bld 7.4 (H) 4.8 - 5.6 %   Mean Plasma Glucose 165.68 mg/dL   Labs Reviewed  COMPREHENSIVE METABOLIC PANEL - Abnormal; Notable for the following components:      Result Value   Glucose, Bld 138 (*)  Total Protein 6.3 (*)    ALT 45 (*)    Alkaline Phosphatase 140 (*)    All other components within normal limits  HEMOGLOBIN A1C - Abnormal; Notable for the following components:   Hgb A1c MFr Bld 7.4 (*)    All other components within normal limits  CBC    Imaging: No results found.  No Known Allergies  Past Medical History:  Diagnosis Date   Diabetes mellitus without complication (HCC)    pt states he has never been told he was a diabetic   Renal disorder    nephrolithiasis   Ureterolithiasis    Social History   Socioeconomic History   Marital status: Married    Spouse name: Not on file   Number of children: Not on file   Years of education: Not on file   Highest education level: Not on file  Occupational History   Not on file  Tobacco Use   Smoking status: Never   Smokeless tobacco: Never  Vaping Use   Vaping Use: Never used  Substance and Sexual Activity   Alcohol use:  No   Drug use: No   Sexual activity: Not on file  Other Topics Concern   Not on file  Social History Narrative   Not on file   Social Determinants of Health   Financial Resource Strain: Not on file  Food Insecurity: Not on file  Transportation Needs: Not on file  Physical Activity: Not on file  Stress: Not on file  Social Connections: Not on file  Intimate Partner Violence: Not on file   No family history on file. Past Surgical History:  Procedure Laterality Date   CYSTOSCOPY WITH STENT PLACEMENT Left 11/28/2017   Procedure: CYSTOSCOPY WITH STENT PLACEMENT;  Surgeon: Heloise Purpura, MD;  Location: [facility];  Service: Urology;  Laterality: Left;   CYSTOSCOPY/URETEROSCOPY/HOLMIUM LASER/STENT PLACEMENT Left 12/17/2017   Procedure: CYSTOSCOPY/URETEROSCOPY/HOLMIUM LASER;  Surgeon: Heloise Purpura, MD;  Location: [facility];  Service: Urology;  Laterality: Left;     Mardella Layman, MD 12/22/21 [phone]

## 2022-09-21 ENCOUNTER — Ambulatory Visit (INDEPENDENT_AMBULATORY_CARE_PROVIDER_SITE_OTHER): Payer: BLUE CROSS/BLUE SHIELD

## 2022-09-21 VITALS — BP 117/82 | HR 88 | Temp 97.7°F | Ht 66.0 in | Wt 138.0 lb

## 2022-09-21 DIAGNOSIS — N200 Calculus of kidney: Secondary | ICD-10-CM | POA: Diagnosis not present

## 2022-09-21 DIAGNOSIS — R748 Abnormal levels of other serum enzymes: Secondary | ICD-10-CM

## 2022-09-21 DIAGNOSIS — Z7984 Long term (current) use of oral hypoglycemic drugs: Secondary | ICD-10-CM

## 2022-09-21 DIAGNOSIS — E119 Type 2 diabetes mellitus without complications: Secondary | ICD-10-CM | POA: Diagnosis not present

## 2022-09-21 DIAGNOSIS — Z13220 Encounter for screening for lipoid disorders: Secondary | ICD-10-CM | POA: Diagnosis not present

## 2022-09-21 DIAGNOSIS — Z1159 Encounter for screening for other viral diseases: Secondary | ICD-10-CM

## 2022-09-21 DIAGNOSIS — Z0000 Encounter for general adult medical examination without abnormal findings: Secondary | ICD-10-CM

## 2022-09-21 LAB — POCT GLYCOSYLATED HEMOGLOBIN (HGB A1C): Hemoglobin A1C: 8.8 % — AB (ref 4.0–5.6)

## 2022-09-21 LAB — GLUCOSE, CAPILLARY: Glucose-Capillary: 287 mg/dL — ABNORMAL HIGH (ref 70–99)

## 2022-09-21 MED ORDER — METFORMIN HCL ER 500 MG PO TB24: ORAL_TABLET | ORAL | 0 refills | Status: DC

## 2022-09-21 NOTE — Patient Instructions (Addendum)
[name], it was a pleasure seeing you today!  Today we discussed: Diabetes  - take metformin, increasing by one pill every week up to 4 pills a day on week four.  Will call you with any abnormal lab results.  I have ordered the following labs today:  Lab Orders         Lipid Profile         Microalbumin / Creatinine Urine Ratio         CMP14 + Anion Gap         Hepatitis C antibody, reflex         POC Hbg A1C      Tests ordered today:  none  Referrals ordered today:   Referral Orders  No referral(s) requested today     I have ordered the following medication/changed the following medications:   Stop the following medications: There are no discontinued medications.   Start the following medications: Meds ordered this encounter  Medications   metFORMIN (GLUCOPHAGE-XR) 500 MG 24 hr tablet    Sig: Take 1 tablet (500 mg total) by mouth daily with breakfast for 7 days, THEN 1 tablet (500 mg total) 3 (three) times daily with meals for 7 days, THEN 1 tablet (500 mg total) 2 (two) times daily with a meal for 7 days, THEN 2 tablets (1,000 mg total) 2 (two) times daily with a meal for 7 days.    Dispense:  70 tablet    Refill:  0     Follow-up: 6 months   Please make sure to arrive 15 minutes prior to your next appointment. If you arrive late, you may be asked to reschedule.   We look forward to seeing you next time. Please call our clinic at [phone] if you have any questions or concerns. The best time to call is Monday-Friday from 9am-4pm, but there is someone available 24/7. If after hours or the weekend, call the main hospital number and ask for the Internal Medicine Resident On-Call. If you need medication refills, please notify your pharmacy one week in advance and they will send [place] a request.  Thank you for letting us take part in your care. Wishing you the best!  Thank you, Adron Bene, MD
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
## 2022-09-21 NOTE — Assessment & Plan Note (Signed)
Patient presents with a history of recurrent nephrolithiasis.  This is also a problem for many of his family members.  He reports that his father had a nephrectomy when the patient was 62 months old.  He denies any symptoms currently.  Denies hematuria.  Had recent removal of ureteral stone.

## 2022-09-21 NOTE — Assessment & Plan Note (Addendum)
Patient presents with a history of diabetes.  Last A1c 7.4 9 months ago.  He does not take his sugars at home.  He was prescribed glimepiride 3 mg [place]  He took his last dose of this yesterday evening.  Endorses polyuria but denies polydipsia. -Repeat A1c -Discontinue glimepiride -Start metformin dose ramp beginning at 500 mg daily and titrating to 2 g daily by the fourth week -Urine microalbumin/creatinine ratio -Needs foot exam and referral for eye exam at follow-up
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
## 2022-09-21 NOTE — Assessment & Plan Note (Addendum)
Patient presents with multiple health care gaps.  He is due for Pneumovax, COVID-vaccine, Shingrix, and Pneumovax but refuses at this time. -Discuss colonoscopy at follow-up -Hep C screening today

## 2022-09-22 LAB — LIPID PANEL
Chol/HDL Ratio: 5.4 ratio — ABNORMAL HIGH (ref 0.0–5.0)
Cholesterol, Total: 305 mg/dL — ABNORMAL HIGH (ref 100–199)
HDL: 56 mg/dL (ref >39)
LDL Chol Calc (NIH): 202 mg/dL — ABNORMAL HIGH (ref 0–99)
Triglycerides: 242 mg/dL — ABNORMAL HIGH (ref 0–149)
VLDL Cholesterol Cal: 47 mg/dL — ABNORMAL HIGH (ref 5–40)

## 2022-09-22 LAB — CMP14 + ANION GAP
ALT: 48 IU/L — ABNORMAL HIGH (ref 0–44)
AST: 34 IU/L (ref 0–40)
Albumin/Globulin Ratio: 1.5
Albumin: 4.5 g/dL (ref 3.9–4.9)
Alkaline Phosphatase: 288 IU/L — ABNORMAL HIGH (ref 44–121)
Anion Gap: 15 mmol/L (ref 10.0–18.0)
BUN/Creatinine Ratio: 10 (ref 10–24)
BUN: 11 mg/dL (ref 8–27)
Bilirubin Total: 0.4 mg/dL (ref 0.0–1.2)
CO2: 24 mmol/L (ref 20–29)
Calcium: 10.3 mg/dL — ABNORMAL HIGH (ref 8.6–10.2)
Chloride: 101 mmol/L (ref 96–106)
Creatinine, Ser: 1.12 mg/dL (ref 0.76–1.27)
Globulin, Total: 3 g/dL (ref 1.5–4.5)
Glucose: 293 mg/dL — ABNORMAL HIGH (ref 70–99)
Potassium: 5.1 mmol/L (ref 3.5–5.2)
Sodium: 140 mmol/L (ref 134–144)
Total Protein: 7.5 g/dL (ref 6.0–8.5)
eGFR: 73 mL/min/{1.73_m2} (ref >59)

## 2022-09-22 LAB — MICROALBUMIN / CREATININE URINE RATIO
Creatinine, Urine: 162.6 mg/dL
Microalb/Creat Ratio: 121 mg/g creat — ABNORMAL HIGH (ref 0–29)
Microalbumin, Urine: 196.7 ug/mL

## 2022-09-22 NOTE — Progress Notes (Signed)
Internal Medicine Clinic Attending  Case discussed with Dr. White  At the time of the visit.  We reviewed the resident's history and exam and pertinent patient test results.  I agree with the assessment, diagnosis, and plan of care documented in the resident's note.  

## 2022-09-22 NOTE — Addendum Note (Signed)
Addended by: Adron Bene on: 09/22/2022 09:51 AM   Modules accepted: Orders

## 2022-09-23 LAB — HCV AB W REFLEX TO QUANT PCR: HCV Ab: NONREACTIVE

## 2022-09-25 LAB — SPECIMEN STATUS REPORT

## 2022-09-25 LAB — HCV INTERPRETATION

## 2022-09-25 MED ORDER — ATORVASTATIN CALCIUM 40 MG PO TABS: 40.0000 mg | ORAL_TABLET | Freq: Every day | ORAL | 11 refills | Status: DC

## 2022-09-25 NOTE — Addendum Note (Signed)
Addended by: Adron Bene on: 09/25/2022 12:35 PM   Modules accepted: Orders

## 2022-09-25 NOTE — Addendum Note (Signed)
Addended by: Bufford Spikes on: 09/25/2022 05:26 PM   Modules accepted: Orders

## 2022-12-18 ENCOUNTER — Encounter (HOSPITAL_BASED_OUTPATIENT_CLINIC_OR_DEPARTMENT_OTHER): Payer: Self-pay | Admitting: Emergency Medicine

## 2022-12-18 ENCOUNTER — Other Ambulatory Visit: Payer: Self-pay

## 2022-12-18 DIAGNOSIS — R160 Hepatomegaly, not elsewhere classified: Secondary | ICD-10-CM | POA: Diagnosis not present

## 2022-12-18 DIAGNOSIS — K5900 Constipation, unspecified: Secondary | ICD-10-CM | POA: Diagnosis not present

## 2022-12-18 DIAGNOSIS — R109 Unspecified abdominal pain: Secondary | ICD-10-CM | POA: Insufficient documentation

## 2022-12-18 DIAGNOSIS — E119 Type 2 diabetes mellitus without complications: Secondary | ICD-10-CM | POA: Insufficient documentation

## 2022-12-18 DIAGNOSIS — N200 Calculus of kidney: Secondary | ICD-10-CM | POA: Diagnosis not present

## 2022-12-18 DIAGNOSIS — Z7984 Long term (current) use of oral hypoglycemic drugs: Secondary | ICD-10-CM | POA: Diagnosis not present

## 2022-12-18 LAB — COMPREHENSIVE METABOLIC PANEL
ALT: 52 U/L — ABNORMAL HIGH (ref 0–44)
AST: 31 U/L (ref 15–41)
Albumin: 4.1 g/dL (ref 3.5–5.0)
Alkaline Phosphatase: 183 U/L — ABNORMAL HIGH (ref 38–126)
Anion gap: 9 (ref 5–15)
BUN: 12 mg/dL (ref 8–23)
CO2: 25 mmol/L (ref 22–32)
Calcium: 9 mg/dL (ref 8.9–10.3)
Chloride: 102 mmol/L (ref 98–111)
Creatinine, Ser: 0.97 mg/dL (ref 0.61–1.24)
GFR, Estimated: >60 mL/min (ref >60)
Glucose, Bld: 154 mg/dL — ABNORMAL HIGH (ref 70–99)
Potassium: 3.8 mmol/L (ref 3.5–5.1)
Sodium: 136 mmol/L (ref 135–145)
Total Bilirubin: 0.6 mg/dL (ref 0.3–1.2)
Total Protein: 7.5 g/dL (ref 6.5–8.1)

## 2022-12-18 LAB — CBG MONITORING, ED: Glucose-Capillary: 137 mg/dL — ABNORMAL HIGH (ref 70–99)

## 2022-12-18 LAB — URINALYSIS, ROUTINE W REFLEX MICROSCOPIC
Bilirubin Urine: NEGATIVE
Glucose, UA: 100 mg/dL — AB
Hgb urine dipstick: NEGATIVE
Ketones, ur: NEGATIVE mg/dL
Leukocytes,Ua: NEGATIVE
Nitrite: NEGATIVE
Protein, ur: NEGATIVE mg/dL
Specific Gravity, Urine: >=1.03 (ref 1.005–1.030)
pH: 5.5 (ref 5.0–8.0)

## 2022-12-18 LAB — CBC
HCT: 42.2 % (ref 39.0–52.0)
Hemoglobin: 14.5 g/dL (ref 13.0–17.0)
MCH: 29.1 pg (ref 26.0–34.0)
MCHC: 34.4 g/dL (ref 30.0–36.0)
MCV: 84.7 fL (ref 80.0–100.0)
Platelets: 303 10*3/uL (ref 150–400)
RBC: 4.98 MIL/uL (ref 4.22–5.81)
RDW: 12.8 % (ref 11.5–15.5)
WBC: 4.9 10*3/uL (ref 4.0–10.5)
nRBC: 0 % (ref 0.0–0.2)

## 2022-12-18 NOTE — ED Triage Notes (Addendum)
Pt reports being without his T2DM medications x 1 month. Endorses blurry vision and right sided flank pain x 3 - 4 months. Also concerned for kidney stone.  Patient well appearing, NAD. Ambulatory with steady gait.

## 2022-12-19 ENCOUNTER — Emergency Department (HOSPITAL_BASED_OUTPATIENT_CLINIC_OR_DEPARTMENT_OTHER)
Admission: EM | Admit: 2022-12-19 | Discharge: 2022-12-19 | Disposition: A | Discharge status: Home / Self Care | Payer: BLUE CROSS/BLUE SHIELD | Attending: Emergency Medicine | Admitting: Emergency Medicine

## 2022-12-19 ENCOUNTER — Emergency Department (HOSPITAL_BASED_OUTPATIENT_CLINIC_OR_DEPARTMENT_OTHER): Payer: BLUE CROSS/BLUE SHIELD

## 2022-12-19 DIAGNOSIS — R160 Hepatomegaly, not elsewhere classified: Secondary | ICD-10-CM | POA: Diagnosis not present

## 2022-12-19 DIAGNOSIS — E119 Type 2 diabetes mellitus without complications: Secondary | ICD-10-CM

## 2022-12-19 DIAGNOSIS — N200 Calculus of kidney: Secondary | ICD-10-CM

## 2022-12-19 DIAGNOSIS — K5900 Constipation, unspecified: Secondary | ICD-10-CM | POA: Diagnosis not present

## 2022-12-19 DIAGNOSIS — R109 Unspecified abdominal pain: Secondary | ICD-10-CM | POA: Diagnosis not present

## 2022-12-19 MED ORDER — METFORMIN HCL ER 500 MG PO TB24: ORAL_TABLET | ORAL | 0 refills | Status: DC

## 2022-12-19 NOTE — Discharge Instructions (Addendum)
Resume taking metformin as previously prescribed.  Follow-up with your primary doctor in the next 1 to 2 weeks, and return to the ER if symptoms significantly worsen or change.

## 2022-12-19 NOTE — ED Provider Notes (Signed)
[facility] Provider Note   CSN: [id number] Arrival date & time: 12/18/22  2042     History  Chief Complaint  Patient presents with   Medication Refill   Flank Pain    Jarion Maanas is a 67 y.o. male.  Patient is a 67 year old male with past medical history of kidney stones, type 2 diabetes, hyperlipidemia.  Patient presenting today with complaints of flank pain.  He describes pain to the right side of his abdomen and back that has been worsening over the past several weeks.  He is concerned he may have another kidney stone.  He denies any fevers or chills.  No nausea, vomiting, or diarrhea.  He also reports that he has been out of his metformin for the past 6 weeks.  He has not been able to see his doctor for refill related to insurance/financial reasons.  The history is provided by the patient.       Home Medications Prior to Admission medications   Medication Sig Start Date End Date Taking? Authorizing Provider  atorvastatin (LIPITOR) 40 MG tablet Take 1 tablet (40 mg total) by mouth daily. 09/25/22 09/25/23  Adron Bene, MD  metFORMIN (GLUCOPHAGE-XR) 500 MG 24 hr tablet Take 1 tablet (500 mg total) by mouth daily with breakfast for 7 days, THEN 1 tablet (500 mg total) 3 (three) times daily with meals for 7 days, THEN 1 tablet (500 mg total) 2 (two) times daily with a meal for 7 days, THEN 2 tablets (1,000 mg total) 2 (two) times daily with a meal for 7 days. 09/21/22 10/19/22  Adron Bene, MD      Allergies    Patient has no known allergies.    Review of Systems   Review of Systems  All other systems reviewed and are negative.   Physical Exam Updated Vital Signs BP 130/84 (BP Location: Left Arm)   Pulse 71   Temp 98 F (36.7 C)   Resp 18   Ht 5\' 5"  (1.651 m)   Wt 58.5 kg   SpO2 100%   BMI 21.47 kg/m  Physical Exam Vitals and nursing note reviewed.  Constitutional:      General: He is not in acute distress.     Appearance: He is well-developed. He is not diaphoretic.  HENT:     Head: Normocephalic and atraumatic.  Cardiovascular:     Rate and Rhythm: Normal rate and regular rhythm.     Heart sounds: No murmur heard.    No friction rub.  Pulmonary:     Effort: Pulmonary effort is normal. No respiratory distress.     Breath sounds: Normal breath sounds. No wheezing or rales.  Abdominal:     General: Bowel sounds are normal. There is no distension.     Palpations: Abdomen is soft.     Tenderness: There is no abdominal tenderness.  Musculoskeletal:        General: Normal range of motion.     Cervical back: Normal range of motion and neck supple.  Skin:    General: Skin is warm and dry.  Neurological:     Mental Status: He is alert and oriented to person, place, and time.     Coordination: Coordination normal.     ED Results / Procedures / Treatments   Labs (all labs ordered are listed, but only abnormal results are displayed) Labs Reviewed  COMPREHENSIVE METABOLIC PANEL - Abnormal; Notable for the following components:  Result Value   Glucose, Bld 154 (*)    ALT 52 (*)    Alkaline Phosphatase 183 (*)    All other components within normal limits  URINALYSIS, ROUTINE W REFLEX MICROSCOPIC - Abnormal; Notable for the following components:   Glucose, UA 100 (*)    All other components within normal limits  CBG MONITORING, ED - Abnormal; Notable for the following components:   Glucose-Capillary 137 (*)    All other components within normal limits  CBC    EKG None  Radiology No results found.  Procedures Procedures    Medications Ordered in ED Medications - No data to display  ED Course/ Medical Decision Making/ A&P  Patient presenting with complaints of right flank pain as described in the HPI.  He arrives here with stable vital signs and physical examination revealing right-sided tenderness.  Workup initiated including CBC and CMP, both of which were unremarkable.   Urinalysis is clear.  CT scan with renal protocol obtained showing no evidence for obstructive uropathy, however he does have several stones in the kidneys bilaterally.  The patient also out of his metformin and this will be refilled.  Patient to follow-up with primary doctor.  Final Clinical Impression(s) / ED Diagnoses Final diagnoses:  None    Rx / DC Orders ED Discharge Orders     None         Geoffery Lyons, MD 12/19/22 [phone]

## 2023-01-03 ENCOUNTER — Encounter: Payer: BLUE CROSS/BLUE SHIELD | Admitting: Student

## 2023-05-16 ENCOUNTER — Ambulatory Visit: Payer: Medicaid Other | Admitting: Student

## 2023-05-16 ENCOUNTER — Encounter: Payer: Self-pay | Admitting: Student

## 2023-05-16 VITALS — BP 137/81 | HR 53 | Temp 97.4°F | Ht 66.0 in | Wt 135.5 lb

## 2023-05-16 DIAGNOSIS — E119 Type 2 diabetes mellitus without complications: Secondary | ICD-10-CM | POA: Diagnosis not present

## 2023-05-16 DIAGNOSIS — Z7984 Long term (current) use of oral hypoglycemic drugs: Secondary | ICD-10-CM | POA: Diagnosis not present

## 2023-05-16 DIAGNOSIS — Z1211 Encounter for screening for malignant neoplasm of colon: Secondary | ICD-10-CM | POA: Insufficient documentation

## 2023-05-16 DIAGNOSIS — I10 Essential (primary) hypertension: Secondary | ICD-10-CM

## 2023-05-16 DIAGNOSIS — E785 Hyperlipidemia, unspecified: Secondary | ICD-10-CM | POA: Diagnosis not present

## 2023-05-16 DIAGNOSIS — R748 Abnormal levels of other serum enzymes: Secondary | ICD-10-CM | POA: Diagnosis not present

## 2023-05-16 LAB — POCT GLYCOSYLATED HEMOGLOBIN (HGB A1C): Hemoglobin A1C: 7.7 % — AB (ref 4.0–5.6)

## 2023-05-16 LAB — GLUCOSE, CAPILLARY: Glucose-Capillary: 147 mg/dL — ABNORMAL HIGH (ref 70–99)

## 2023-05-16 MED ORDER — ROSUVASTATIN CALCIUM 10 MG PO TABS: 10.0000 mg | ORAL_TABLET | Freq: Every day | ORAL | 11 refills | Status: DC

## 2023-05-16 MED ORDER — LOSARTAN POTASSIUM 25 MG PO TABS: 25.0000 mg | ORAL_TABLET | Freq: Every day | ORAL | 11 refills | Status: DC

## 2023-05-16 MED ORDER — METFORMIN HCL ER 500 MG PO TB24: ORAL_TABLET | ORAL | 2 refills | Status: DC

## 2023-05-16 NOTE — Assessment & Plan Note (Signed)
 Patient has a past medical history of diabetes.  His A1c in June 2024 was 8.8, and A1c today is 7.7.  He has slowly improved.  He reports that he is taking metformin  500 mg daily.  However, he was previously written to take it 1000 mg twice daily.  Today he does report polyuria and polydipsia.  He denies missing any doses of his medication.  He reports he otherwise is doing well.  With A1c at 7.7, he is approaching goal.  Patient is not on any renal protective agents.  Will plan to start on today.  Will plan to ramp up metformin  to 1000 mg twice daily.  Plan: -Foot exam updated today, no abnormalities -Ramp-up metformin  to 1000 mg twice daily -Patient referred to ophthalmology for dilated retinal exam -A1c in 3 months

## 2023-05-16 NOTE — Assessment & Plan Note (Signed)
 Patient has a history of hypertension.  Blood pressure today 142/100 followed by 137/81.  His goal would be less than 130/80.  He denies any chest pain, headaches, vision changes, or shortness of breath.  Plan: -Obtain CMP -Start losartan  25 mg daily -Follow-up with BMP in 3 months

## 2023-05-16 NOTE — Assessment & Plan Note (Signed)
 Patient has a past medical history of hyperlipidemia. Patient had a lipid panel in June 2024 which showed elevated cholesterol at 305, elevated triglycerides at 242 and elevated LDL at 203.  Patient was started on atorvastatin  40 mg daily, but stopped it because he was getting dizzy taking this.  Will start a small dose of rosuvastatin  today.  Plan: -Start rosuvastatin  10 mg daily

## 2023-05-16 NOTE — Assessment & Plan Note (Signed)
 Patient referred for colonoscopy for colon cancer screening.
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
## 2023-05-16 NOTE — Patient Instructions (Addendum)
 Terry Hall,Thank you for allowing me to take part in your care today.  Here are your instructions.  1.  Regarding your diabetes, ramp up your metformin  to 1000 mg twice daily.  Start with 500 mg daily for the next 7 days, then increase to 500 mg in the morning and at night for 7 days, and then increase to 1000 mg in the morning and 500 mg at night for 7 days, and then go up to 1000 mg in the morning and 1000 mg at night for the rest of the time.  Do this in 7-day increments.  Please take this with dinner  2.  Please start taking losartan  25 mg daily.  3.  Regarding your cholesterol, please start taking rosuvastatin  10 mg daily.  4.  I have referred you for colonoscopy.  5.  I have referred you for a dilated retinal exam with an ophthalmologist.  6.  I will check some labs today, I will call you with the results  7.  Please come back in 3 months and we can check your A1c again.    Thank you, Dr. Tobie  If you have any other questions please contact the internal medicine clinic at [phone] If it is after hours, please call the [facility] at [phone] and then ask the person who picks up for the resident on call.

## 2023-05-17 NOTE — Progress Notes (Signed)
 Internal Medicine Clinic Attending  Case discussed with the resident physician at the time of the visit.  We reviewed the patient's history, exam, and pertinent patient test results.  I agree with the assessment, diagnosis, and plan of care documented in the resident's note.

## 2023-05-18 ENCOUNTER — Encounter: Payer: Self-pay | Admitting: Student

## 2023-05-18 ENCOUNTER — Telehealth: Payer: Self-pay | Admitting: Student

## 2023-05-18 DIAGNOSIS — R748 Abnormal levels of other serum enzymes: Secondary | ICD-10-CM

## 2023-05-18 LAB — CBC
Hematocrit: 45.2 % (ref 37.5–51.0)
Hemoglobin: 15.5 g/dL (ref 13.0–17.7)
MCH: 29.1 pg (ref 26.6–33.0)
MCHC: 34.3 g/dL (ref 31.5–35.7)
MCV: 85 fL (ref 79–97)
Platelets: 307 10*3/uL (ref 150–450)
RBC: 5.32 x10E6/uL (ref 4.14–5.80)
RDW: 12.5 % (ref 11.6–15.4)
WBC: 3.9 10*3/uL (ref 3.4–10.8)

## 2023-05-18 LAB — CMP14 + ANION GAP
ALT: 28 [IU]/L (ref 0–44)
AST: 13 [IU]/L (ref 0–40)
Albumin: 4.7 g/dL (ref 3.9–4.9)
Alkaline Phosphatase: 183 [IU]/L — ABNORMAL HIGH (ref 44–121)
Anion Gap: 15 mmol/L (ref 10.0–18.0)
BUN/Creatinine Ratio: 12 (ref 10–24)
BUN: 12 mg/dL (ref 8–27)
Bilirubin Total: 0.4 mg/dL (ref 0.0–1.2)
CO2: 26 mmol/L (ref 20–29)
Calcium: 9.9 mg/dL (ref 8.6–10.2)
Chloride: 99 mmol/L (ref 96–106)
Creatinine, Ser: 0.97 mg/dL (ref 0.76–1.27)
Globulin, Total: 2.5 g/dL (ref 1.5–4.5)
Glucose: 162 mg/dL — ABNORMAL HIGH (ref 70–99)
Potassium: 4.4 mmol/L (ref 3.5–5.2)
Sodium: 140 mmol/L (ref 134–144)
Total Protein: 7.2 g/dL (ref 6.0–8.5)
eGFR: 87 mL/min/{1.73_m2} (ref >59)

## 2023-05-18 LAB — HEPATITIS B SURFACE ANTIGEN: Hepatitis B Surface Ag: NEGATIVE

## 2023-05-18 LAB — GAMMA GT: GGT: 133 [IU]/L — ABNORMAL HIGH (ref 0–65)

## 2023-05-18 LAB — HCV INTERPRETATION

## 2023-05-18 LAB — HEPATITIS B CORE ANTIBODY, TOTAL: Hep B Core Total Ab: POSITIVE — AB

## 2023-05-18 LAB — HEPATITIS B SURFACE ANTIBODY,QUALITATIVE: Hep B Surface Ab, Qual: REACTIVE

## 2023-05-18 LAB — HCV AB W REFLEX TO QUANT PCR: HCV Ab: NONREACTIVE

## 2023-05-18 NOTE — Telephone Encounter (Signed)
 Called patient regarding labs.  Please see result note.  Overall, interpretation of labs showing concern for either past infection of hepatitis B or chronic infection of hepatitis B.  Will check IgM antibody as well as hepatitis B viral load to see if patient has active infection or not.  Will have patient come back in for labs.

## 2023-12-27 ENCOUNTER — Telehealth: Payer: Self-pay

## 2023-12-27 NOTE — Telephone Encounter (Signed)
 I called pt back, but no answer. I left message to call the clinic back.

## 2023-12-27 NOTE — Telephone Encounter (Signed)
 Copied from CRM [id number]. Topic: Appointments - Appointment Scheduling >> Dec 27, 2023  3:33 PM Carrielelia G wrote: Patient wife has an appt on sept 30 at 9:45am... He would like to come in with her. Please advise.

## 2024-01-22 ENCOUNTER — Ambulatory Visit: Admitting: Student

## 2024-01-22 NOTE — Progress Notes (Deleted)
   CC: ***  HPI:  Mr.Zackerie Mahmoud is a 68 y.o. male with past medical history of diabetes, hypertension, hyperlipidemia who presents for follow-up appointment.  Please see assessment and plan for full HPI.  Medications: Diabetes: Metformin  1000 mg twice daily Hyperlipidemia: Rosuvastatin  10 mg daily Hypertension: Losartan  25 mg daily  Will need hepatitis B viral panel.  Recheck A1c  Check CMP  Urine ACR Colonoscopy Flu  Past Medical History:  Diagnosis Date   Diabetes mellitus without complication (HCC)    pt states he has never been told he was a diabetic   Renal disorder    nephrolithiasis   Ureterolithiasis      Current Outpatient Medications:    losartan  (COZAAR ) 25 MG tablet, Take 1 tablet (25 mg total) by mouth daily., Disp: 30 tablet, Rfl: 11   metFORMIN  (GLUCOPHAGE -XR) 500 MG 24 hr tablet, Take 1 tablet (500 mg total) by mouth daily with breakfast for 7 days, THEN 1 tablet (500 mg total) 3 (three) times daily with meals for 7 days, THEN 1 tablet (500 mg total) 2 (two) times daily with a meal for 7 days, THEN 2 tablets (1,000 mg total) 2 (two) times daily with a meal., Disp: 162 tablet, Rfl: 2   rosuvastatin  (CRESTOR ) 10 MG tablet, Take 1 tablet (10 mg total) by mouth daily., Disp: 30 tablet, Rfl: 11  Review of Systems:  ***  Constitutional: Eye: Respiratory: Cardiovascular: GI: MSK: GU: Skin: Neuro: Endocrine:   Physical Exam:  There were no vitals filed for this visit. *** General: Patient is sitting comfortably in the room  Eyes: Pupils equal and reactive to light, EOM intact  Head: Normocephalic, atraumatic  Neck: Supple, nontender, full range of motion, No JVD Cardio: Regular rate and rhythm, no murmurs, rubs or gallops. 2+ pulses to bilateral upper and lower extremities  Chest: No chest tenderness Pulmonary: Clear to ausculation bilaterally with no rales, rhonchi, and crackles  Abdomen: Soft, nontender with normoactive bowel sounds with no  rebound or guarding  Neuro: Alert and orientated x3. CN II-XII intact. Sensation intact to upper and lower extremities. 2+ patellar reflex.  Back: No midline tenderness, no step off or deformities noted. No paraspinal muscle tenderness.  Skin: No rashes noted  MSK: 5/5 strength to upper and lower extremities.    Assessment & Plan:   Assessment & Plan Diabetes mellitus without complication (HCC)      Patient {GC/[id number]::discussed with,seen with} Dr. {[id number]::Hlpoonli,Ynqqfjw,Floozw,Wjmzwimj,Tpoopjfd,Cpwrzwu}  Libby Blanch, DO Internal Medicine Resident PGY-3

## 2024-03-31 LAB — OPHTHALMOLOGY REPORT-SCANNED

## 2024-04-14 ENCOUNTER — Encounter: Payer: Self-pay | Admitting: Student

## 2024-04-14 ENCOUNTER — Telehealth: Payer: Self-pay | Admitting: Student

## 2024-04-14 ENCOUNTER — Other Ambulatory Visit: Payer: Self-pay

## 2024-04-14 ENCOUNTER — Ambulatory Visit (INDEPENDENT_AMBULATORY_CARE_PROVIDER_SITE_OTHER): Payer: Self-pay | Admitting: Student

## 2024-04-14 VITALS — BP 137/79 | HR 73 | Temp 97.7°F | Ht 65.0 in | Wt 138.6 lb

## 2024-04-14 DIAGNOSIS — Z7984 Long term (current) use of oral hypoglycemic drugs: Secondary | ICD-10-CM

## 2024-04-14 DIAGNOSIS — E785 Hyperlipidemia, unspecified: Secondary | ICD-10-CM

## 2024-04-14 DIAGNOSIS — E1129 Type 2 diabetes mellitus with other diabetic kidney complication: Secondary | ICD-10-CM

## 2024-04-14 DIAGNOSIS — E119 Type 2 diabetes mellitus without complications: Secondary | ICD-10-CM

## 2024-04-14 DIAGNOSIS — R748 Abnormal levels of other serum enzymes: Secondary | ICD-10-CM

## 2024-04-14 DIAGNOSIS — R809 Proteinuria, unspecified: Secondary | ICD-10-CM | POA: Diagnosis not present

## 2024-04-14 DIAGNOSIS — I10 Essential (primary) hypertension: Secondary | ICD-10-CM

## 2024-04-14 DIAGNOSIS — M25561 Pain in right knee: Secondary | ICD-10-CM | POA: Diagnosis not present

## 2024-04-14 DIAGNOSIS — G8929 Other chronic pain: Secondary | ICD-10-CM | POA: Diagnosis not present

## 2024-04-14 LAB — POCT GLYCOSYLATED HEMOGLOBIN (HGB A1C): HbA1c, POC (controlled diabetic range): 9.2 % — AB (ref 0.0–7.0)

## 2024-04-14 LAB — GLUCOSE, CAPILLARY: Glucose-Capillary: 226 mg/dL — ABNORMAL HIGH (ref 70–99)

## 2024-04-14 MED ORDER — LOSARTAN POTASSIUM 25 MG PO TABS: 25.0000 mg | ORAL_TABLET | Freq: Every day | ORAL | 3 refills | Status: AC

## 2024-04-14 MED ORDER — ROSUVASTATIN CALCIUM 10 MG PO TABS: 10.0000 mg | ORAL_TABLET | Freq: Every day | ORAL | 3 refills | Status: DC

## 2024-04-14 MED ORDER — METFORMIN HCL ER 500 MG PO TB24: 1000.0000 mg | ORAL_TABLET | Freq: Every day | ORAL | 3 refills | Status: AC

## 2024-04-14 NOTE — Telephone Encounter (Signed)
 Message forwarded to [facility].  Copied from CRM [id number]. Topic: General - Other >> Apr 14, 2024  3:55 PM Mercer PEDLAR wrote: Reason for CRM: Patient would like callback for status update for parking decal application. He stated that he completed it during his appointment today 04/14/24 but when he went to the [facility] he was told that they have not received it.
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
## 2024-04-14 NOTE — Patient Instructions (Signed)
 Thank you, Terry Hall, for allowing us  to provide your care today. Today we discussed . . .  > Diabetes       - We will restart your medicines that you have been on in the past for your diabetes.  The most important medicine for your blood sugars is the metformin .  You can take 1 pill twice daily for the first week and then increase to 2 pills twice daily after that.  2 pills twice daily to equal the dose of 1000 mg twice daily.  We will have you back in 3 months to repeat your A1c. > High blood pressure       - Please resume taking the losartan  25 mg daily and we will plan to recheck your blood pressure at your next visit.  Today we will draw blood to check your kidney function and electrolytes.  I will call you if there are any abnormalities that we need to change treatment for.  I have ordered the following labs for you:  Lab Orders         Comprehensive metabolic panel with GFR         Microalbumin / creatinine urine ratio         Glucose, capillary         POC Hbg A1C       Referrals ordered today:   Referral Orders         Ambulatory referral to Orthopedic Surgery        Follow up: 3 months    Remember:  Should you have any questions or concerns please call the internal medicine clinic at [phone].     Fairy Pool, DO [facility]

## 2024-04-15 LAB — LIPID PANEL
Chol/HDL Ratio: 5.5 ratio — ABNORMAL HIGH (ref 0.0–5.0)
Cholesterol, Total: 278 mg/dL — ABNORMAL HIGH (ref 100–199)
HDL: 51 mg/dL
LDL Chol Calc (NIH): 205 mg/dL — ABNORMAL HIGH (ref 0–99)
Triglycerides: 122 mg/dL (ref 0–149)
VLDL Cholesterol Cal: 22 mg/dL (ref 5–40)

## 2024-04-15 LAB — MICROALBUMIN / CREATININE URINE RATIO
Creatinine, Urine: 142.6 mg/dL
Microalb/Creat Ratio: 198 mg/g{creat} — ABNORMAL HIGH (ref 0–29)
Microalbumin, Urine: 282.4 ug/mL

## 2024-04-15 LAB — COMPREHENSIVE METABOLIC PANEL WITH GFR
ALT: 23 IU/L (ref 0–44)
AST: 16 IU/L (ref 0–40)
Albumin: 4.6 g/dL (ref 3.9–4.9)
Alkaline Phosphatase: 235 IU/L — ABNORMAL HIGH (ref 47–123)
BUN/Creatinine Ratio: 14 (ref 10–24)
BUN: 13 mg/dL (ref 8–27)
Bilirubin Total: 0.4 mg/dL (ref 0.0–1.2)
CO2: 24 mmol/L (ref 20–29)
Calcium: 9.8 mg/dL (ref 8.6–10.2)
Chloride: 104 mmol/L (ref 96–106)
Creatinine, Ser: 0.95 mg/dL (ref 0.76–1.27)
Globulin, Total: 2.4 g/dL (ref 1.5–4.5)
Glucose: 216 mg/dL — ABNORMAL HIGH (ref 70–99)
Potassium: 4.4 mmol/L (ref 3.5–5.2)
Sodium: 142 mmol/L (ref 134–144)
Total Protein: 7 g/dL (ref 6.0–8.5)
eGFR: 88 mL/min/1.73

## 2024-04-16 NOTE — Assessment & Plan Note (Signed)
 A1c today 9.2 which is increased from 7.7.  He has been out of metformin  for about 2 months at least.  He denied any side effects on this medicine.  We discussed GLP-1 agonist therapy but he is hesitant to start this.  ACR today elevated at 198 and was previously 121.  He is prescribed losartan  25 mg daily but has not been taking it.  He would also benefit from SGLT2 inhibitor therapy.  Attempted to call the patient to discuss this but was unable to reach him and will attempt to call again or send him a MyChart message. - Resume metformin  1000 mg twice daily - Resume losartan  25 mg daily - Consider starting SGLT2 inhibitor, may need prior authorization - Return in 3 months for repeat A1c

## 2024-04-16 NOTE — Assessment & Plan Note (Signed)
 Blood pressure today slightly elevated at 137/79.  He was prescribed losartan  25 mg daily but has not been taking this.  He is agreeable to resume this and as above has some moderate microalbuminuria.  Metabolic panel today shows stable renal function and electrolytes. - Resume losartan  25 mg daily

## 2024-04-16 NOTE — Progress Notes (Signed)
 Internal Medicine Clinic Attending  Case discussed with the resident at the time of the visit.  We reviewed the resident's history and exam and pertinent patient test results.  I agree with the assessment, diagnosis, and plan of care documented in the resident's note.

## 2024-04-16 NOTE — Assessment & Plan Note (Signed)
 CMP today shows stable elevated alkaline phosphatase level of 235.  Remainder of liver enzymes are within normal limits.  Further evaluation with hepatitis serologies and GGT in 05/2023 showed elevated GGT.  Fib 4 score 0.72, advanced fibrosis excluded.  Patient remains asymptomatic.  Most recent abdominal imaging from 12/2022 shows a stable 2.6 cm lobular hypodense mass in the subcapsular region in the posterior right lobe of the liver but no gallstones, gallbladder wall thickening, or biliary dilation was noted.  For further evaluation of his elevated alk phos we will send for right upper quadrant ultrasound and if there is dilated biliary ducts we will send to GI for MRCP/ERCP and if there is no ductal dilation we will check antimitochondrial antibody. - Right upper quadrant ultrasound

## 2024-04-16 NOTE — Telephone Encounter (Signed)
 I contacted to let him know that his disability parking placard has been completed and ready for pick up. The form will be at the from at front desk. Advised pt to call back if he needs further assistance.

## 2024-04-16 NOTE — Assessment & Plan Note (Signed)
 Lipid panel shows LDL largely stable at 205.  He is prescribed rosuvastatin  10 mg daily after he had some dizziness while taking atorvastatin .  He will resume rosuvastatin  and I will advise him to increase the dose to 20 mg daily over the phone or through MyChart message. - Resume rosuvastatin  20 mg daily ideally, but he may be taking 10 mg daily - Repeat lipid panel at next visit

## 2024-04-18 ENCOUNTER — Ambulatory Visit: Payer: Self-pay | Admitting: Student

## 2024-04-18 DIAGNOSIS — E119 Type 2 diabetes mellitus without complications: Secondary | ICD-10-CM

## 2024-04-18 DIAGNOSIS — E785 Hyperlipidemia, unspecified: Secondary | ICD-10-CM

## 2024-04-18 DIAGNOSIS — R809 Proteinuria, unspecified: Secondary | ICD-10-CM

## 2024-04-18 MED ORDER — EMPAGLIFLOZIN 10 MG PO TABS: 10.0000 mg | ORAL_TABLET | Freq: Every day | ORAL | 3 refills | Status: AC

## 2024-04-18 MED ORDER — ROSUVASTATIN CALCIUM 20 MG PO TABS: 20.0000 mg | ORAL_TABLET | Freq: Every day | ORAL | 3 refills | Status: AC

## 2024-04-18 NOTE — Progress Notes (Signed)
 See clinic note from 04/14/2024 for full details. LDL 205, resumed rosuvastatin  10 mg daily, increase to 20 mg and repeat lipid panel at next visit. RUQ US  for Alk Phos elevation. Resume losartan  and add jardiance  for ACR of 198.

## 2024-05-07 ENCOUNTER — Inpatient Hospital Stay (HOSPITAL_COMMUNITY): Admission: RE | Admit: 2024-05-07 | Source: Ambulatory Visit

## 2024-05-07 ENCOUNTER — Ambulatory Visit (HOSPITAL_COMMUNITY)

## 2024-07-14 ENCOUNTER — Ambulatory Visit: Payer: Self-pay | Admitting: Student
